# Patient Record
Sex: Female | Born: 1945
Health system: Southern US, Community
[De-identification: ages and names within clinical notes are randomized; demographics above are authoritative.]

## PROBLEM LIST (undated history)

## (undated) DIAGNOSIS — I10 Essential (primary) hypertension: Secondary | ICD-10-CM

---

## 2004-07-01 ENCOUNTER — Ambulatory Visit: Payer: Self-pay | Admitting: Internal Medicine

## 2005-07-03 ENCOUNTER — Ambulatory Visit: Payer: Self-pay | Admitting: Internal Medicine

## 2005-09-05 ENCOUNTER — Ambulatory Visit: Payer: Self-pay | Admitting: Gastroenterology

## 2006-07-07 ENCOUNTER — Ambulatory Visit: Payer: Self-pay | Admitting: Internal Medicine

## 2007-07-08 ENCOUNTER — Ambulatory Visit: Payer: Self-pay | Admitting: Internal Medicine

## 2007-11-11 ENCOUNTER — Ambulatory Visit: Payer: Self-pay | Admitting: Internal Medicine

## 2008-07-10 ENCOUNTER — Ambulatory Visit: Payer: Self-pay | Admitting: Internal Medicine

## 2008-08-22 ENCOUNTER — Ambulatory Visit: Payer: Self-pay | Admitting: Internal Medicine

## 2008-09-14 ENCOUNTER — Ambulatory Visit: Payer: Self-pay | Admitting: Gastroenterology

## 2009-07-12 ENCOUNTER — Ambulatory Visit: Payer: Self-pay | Admitting: Internal Medicine

## 2009-07-24 ENCOUNTER — Ambulatory Visit: Payer: Self-pay | Admitting: Internal Medicine

## 2009-08-10 ENCOUNTER — Ambulatory Visit: Payer: Self-pay | Admitting: Internal Medicine

## 2009-08-20 ENCOUNTER — Ambulatory Visit: Payer: Self-pay | Admitting: Internal Medicine

## 2009-09-05 ENCOUNTER — Ambulatory Visit: Payer: Self-pay | Admitting: Gastroenterology

## 2009-09-20 ENCOUNTER — Ambulatory Visit: Payer: Self-pay | Admitting: Internal Medicine

## 2010-07-06 ENCOUNTER — Emergency Department: Payer: Self-pay | Admitting: Emergency Medicine

## 2010-09-03 ENCOUNTER — Ambulatory Visit: Payer: Self-pay | Admitting: Internal Medicine

## 2010-09-09 ENCOUNTER — Ambulatory Visit: Payer: Self-pay | Admitting: Internal Medicine

## 2010-09-14 ENCOUNTER — Ambulatory Visit: Payer: Self-pay | Admitting: Ophthalmology

## 2011-09-04 ENCOUNTER — Ambulatory Visit: Payer: Self-pay | Admitting: Internal Medicine

## 2011-11-10 ENCOUNTER — Ambulatory Visit: Payer: Self-pay

## 2011-12-04 ENCOUNTER — Ambulatory Visit: Payer: Self-pay

## 2011-12-21 ENCOUNTER — Ambulatory Visit: Payer: Self-pay

## 2012-01-21 ENCOUNTER — Ambulatory Visit: Payer: Self-pay

## 2012-09-06 ENCOUNTER — Ambulatory Visit: Payer: Self-pay

## 2013-09-19 ENCOUNTER — Emergency Department: Payer: Self-pay | Admitting: Emergency Medicine

## 2013-09-20 LAB — CBC
HCT: 37.1 % (ref 35.0–47.0)
HGB: 12.4 g/dL (ref 12.0–16.0)
MCH: 28 pg (ref 26.0–34.0)
MCHC: 33.3 g/dL (ref 32.0–36.0)
MCV: 84 fL (ref 80–100)
PLATELETS: 199 10*3/uL (ref 150–440)
RBC: 4.41 10*6/uL (ref 3.80–5.20)
RDW: 15.6 % — AB (ref 11.5–14.5)
WBC: 10.6 10*3/uL (ref 3.6–11.0)

## 2013-09-20 LAB — URINALYSIS, COMPLETE
Bilirubin,UR: NEGATIVE
Blood: NEGATIVE
Glucose,UR: NEGATIVE mg/dL (ref 0–75)
KETONE: NEGATIVE
Nitrite: NEGATIVE
PH: 5 (ref 4.5–8.0)
Protein: NEGATIVE
RBC,UR: 2 /HPF (ref 0–5)
Specific Gravity: 1.019 (ref 1.003–1.030)
Squamous Epithelial: NONE SEEN

## 2013-09-20 LAB — COMPREHENSIVE METABOLIC PANEL
ALK PHOS: 73 U/L
ALT: 26 U/L
Albumin: 3.3 g/dL — ABNORMAL LOW (ref 3.4–5.0)
Anion Gap: 6 — ABNORMAL LOW (ref 7–16)
BUN: 16 mg/dL (ref 7–18)
Bilirubin,Total: 0.4 mg/dL (ref 0.2–1.0)
CALCIUM: 8.6 mg/dL (ref 8.5–10.1)
Chloride: 108 mmol/L — ABNORMAL HIGH (ref 98–107)
Co2: 28 mmol/L (ref 21–32)
Creatinine: 0.99 mg/dL (ref 0.60–1.30)
GFR CALC NON AF AMER: 58 — AB
GLUCOSE: 119 mg/dL — AB (ref 65–99)
OSMOLALITY: 285 (ref 275–301)
Potassium: 3.4 mmol/L — ABNORMAL LOW (ref 3.5–5.1)
SGOT(AST): 20 U/L (ref 15–37)
Sodium: 142 mmol/L (ref 136–145)
Total Protein: 6.9 g/dL (ref 6.4–8.2)

## 2013-09-21 LAB — URINE CULTURE

## 2013-11-07 ENCOUNTER — Ambulatory Visit: Payer: Self-pay | Admitting: Gastroenterology

## 2014-05-08 DIAGNOSIS — I10 Essential (primary) hypertension: Secondary | ICD-10-CM | POA: Diagnosis not present

## 2014-05-08 DIAGNOSIS — E780 Pure hypercholesterolemia: Secondary | ICD-10-CM | POA: Diagnosis not present

## 2014-05-08 DIAGNOSIS — E119 Type 2 diabetes mellitus without complications: Secondary | ICD-10-CM | POA: Diagnosis not present

## 2014-05-08 DIAGNOSIS — E538 Deficiency of other specified B group vitamins: Secondary | ICD-10-CM | POA: Diagnosis not present

## 2014-05-16 DIAGNOSIS — I10 Essential (primary) hypertension: Secondary | ICD-10-CM | POA: Diagnosis not present

## 2014-05-16 DIAGNOSIS — E119 Type 2 diabetes mellitus without complications: Secondary | ICD-10-CM | POA: Diagnosis not present

## 2014-05-16 DIAGNOSIS — M810 Age-related osteoporosis without current pathological fracture: Secondary | ICD-10-CM | POA: Diagnosis not present

## 2014-07-19 ENCOUNTER — Other Ambulatory Visit: Payer: Self-pay | Admitting: Internal Medicine

## 2014-07-19 DIAGNOSIS — Z1231 Encounter for screening mammogram for malignant neoplasm of breast: Secondary | ICD-10-CM

## 2014-07-25 ENCOUNTER — Ambulatory Visit: Payer: Self-pay

## 2014-07-26 ENCOUNTER — Ambulatory Visit
Admission: RE | Admit: 2014-07-26 | Discharge: 2014-07-26 | Disposition: A | Discharge status: Home / Self Care | Payer: Commercial Managed Care - HMO | Source: Ambulatory Visit | Attending: Internal Medicine | Admitting: Internal Medicine

## 2014-07-26 ENCOUNTER — Other Ambulatory Visit: Payer: Self-pay | Admitting: Internal Medicine

## 2014-07-26 DIAGNOSIS — Z1231 Encounter for screening mammogram for malignant neoplasm of breast: Secondary | ICD-10-CM

## 2014-07-31 ENCOUNTER — Other Ambulatory Visit: Payer: Self-pay | Admitting: Internal Medicine

## 2014-07-31 DIAGNOSIS — N6489 Other specified disorders of breast: Secondary | ICD-10-CM

## 2014-07-31 DIAGNOSIS — R928 Other abnormal and inconclusive findings on diagnostic imaging of breast: Secondary | ICD-10-CM

## 2014-08-02 ENCOUNTER — Ambulatory Visit
Admission: RE | Admit: 2014-08-02 | Discharge: 2014-08-02 | Disposition: A | Discharge status: Home / Self Care | Payer: Commercial Managed Care - HMO | Source: Ambulatory Visit | Attending: Internal Medicine | Admitting: Internal Medicine

## 2014-08-02 DIAGNOSIS — N6489 Other specified disorders of breast: Secondary | ICD-10-CM

## 2014-08-02 DIAGNOSIS — R928 Other abnormal and inconclusive findings on diagnostic imaging of breast: Secondary | ICD-10-CM

## 2014-08-25 DIAGNOSIS — J069 Acute upper respiratory infection, unspecified: Secondary | ICD-10-CM | POA: Diagnosis not present

## 2014-09-08 DIAGNOSIS — I10 Essential (primary) hypertension: Secondary | ICD-10-CM | POA: Diagnosis not present

## 2014-09-08 DIAGNOSIS — E119 Type 2 diabetes mellitus without complications: Secondary | ICD-10-CM | POA: Diagnosis not present

## 2014-09-08 DIAGNOSIS — M810 Age-related osteoporosis without current pathological fracture: Secondary | ICD-10-CM | POA: Diagnosis not present

## 2014-09-15 DIAGNOSIS — E780 Pure hypercholesterolemia: Secondary | ICD-10-CM | POA: Diagnosis not present

## 2014-09-15 DIAGNOSIS — R0989 Other specified symptoms and signs involving the circulatory and respiratory systems: Secondary | ICD-10-CM | POA: Diagnosis not present

## 2014-09-15 DIAGNOSIS — I10 Essential (primary) hypertension: Secondary | ICD-10-CM | POA: Diagnosis not present

## 2014-09-15 DIAGNOSIS — E119 Type 2 diabetes mellitus without complications: Secondary | ICD-10-CM | POA: Diagnosis not present

## 2014-09-22 DIAGNOSIS — I10 Essential (primary) hypertension: Secondary | ICD-10-CM | POA: Diagnosis not present

## 2014-09-22 DIAGNOSIS — E780 Pure hypercholesterolemia: Secondary | ICD-10-CM | POA: Diagnosis not present

## 2014-09-22 DIAGNOSIS — I6523 Occlusion and stenosis of bilateral carotid arteries: Secondary | ICD-10-CM | POA: Diagnosis not present

## 2014-09-22 DIAGNOSIS — R0989 Other specified symptoms and signs involving the circulatory and respiratory systems: Secondary | ICD-10-CM | POA: Diagnosis not present

## 2014-10-27 DIAGNOSIS — Z23 Encounter for immunization: Secondary | ICD-10-CM | POA: Diagnosis not present

## 2015-03-13 DIAGNOSIS — I10 Essential (primary) hypertension: Secondary | ICD-10-CM | POA: Diagnosis not present

## 2015-03-13 DIAGNOSIS — E119 Type 2 diabetes mellitus without complications: Secondary | ICD-10-CM | POA: Diagnosis not present

## 2015-03-13 DIAGNOSIS — R0989 Other specified symptoms and signs involving the circulatory and respiratory systems: Secondary | ICD-10-CM | POA: Diagnosis not present

## 2015-03-13 DIAGNOSIS — E7800 Pure hypercholesterolemia, unspecified: Secondary | ICD-10-CM | POA: Diagnosis not present

## 2015-03-19 DIAGNOSIS — E119 Type 2 diabetes mellitus without complications: Secondary | ICD-10-CM | POA: Diagnosis not present

## 2015-03-19 DIAGNOSIS — G2581 Restless legs syndrome: Secondary | ICD-10-CM | POA: Diagnosis not present

## 2015-03-19 DIAGNOSIS — E7800 Pure hypercholesterolemia, unspecified: Secondary | ICD-10-CM | POA: Diagnosis not present

## 2015-03-19 DIAGNOSIS — I10 Essential (primary) hypertension: Secondary | ICD-10-CM | POA: Diagnosis not present

## 2015-04-27 DIAGNOSIS — J069 Acute upper respiratory infection, unspecified: Secondary | ICD-10-CM | POA: Diagnosis not present

## 2015-05-24 DIAGNOSIS — E119 Type 2 diabetes mellitus without complications: Secondary | ICD-10-CM | POA: Diagnosis not present

## 2015-08-20 ENCOUNTER — Other Ambulatory Visit: Payer: Self-pay | Admitting: Internal Medicine

## 2015-08-20 DIAGNOSIS — Z1231 Encounter for screening mammogram for malignant neoplasm of breast: Secondary | ICD-10-CM

## 2015-09-05 ENCOUNTER — Ambulatory Visit: Payer: Medicare HMO

## 2015-09-12 DIAGNOSIS — E7800 Pure hypercholesterolemia, unspecified: Secondary | ICD-10-CM | POA: Diagnosis not present

## 2015-09-12 DIAGNOSIS — G2581 Restless legs syndrome: Secondary | ICD-10-CM | POA: Diagnosis not present

## 2015-09-12 DIAGNOSIS — I10 Essential (primary) hypertension: Secondary | ICD-10-CM | POA: Diagnosis not present

## 2015-09-12 DIAGNOSIS — E119 Type 2 diabetes mellitus without complications: Secondary | ICD-10-CM | POA: Diagnosis not present

## 2015-09-19 DIAGNOSIS — Z0000 Encounter for general adult medical examination without abnormal findings: Secondary | ICD-10-CM | POA: Diagnosis not present

## 2015-09-19 DIAGNOSIS — Z780 Asymptomatic menopausal state: Secondary | ICD-10-CM | POA: Diagnosis not present

## 2015-09-19 DIAGNOSIS — E119 Type 2 diabetes mellitus without complications: Secondary | ICD-10-CM | POA: Diagnosis not present

## 2015-09-19 DIAGNOSIS — I10 Essential (primary) hypertension: Secondary | ICD-10-CM | POA: Diagnosis not present

## 2015-09-19 DIAGNOSIS — E7800 Pure hypercholesterolemia, unspecified: Secondary | ICD-10-CM | POA: Diagnosis not present

## 2015-09-27 DIAGNOSIS — Z780 Asymptomatic menopausal state: Secondary | ICD-10-CM | POA: Diagnosis not present

## 2015-10-12 DIAGNOSIS — Z23 Encounter for immunization: Secondary | ICD-10-CM | POA: Diagnosis not present

## 2015-10-15 ENCOUNTER — Ambulatory Visit
Admission: RE | Admit: 2015-10-15 | Discharge: 2015-10-15 | Disposition: A | Discharge status: Home / Self Care | Payer: Commercial Managed Care - HMO | Source: Ambulatory Visit | Attending: Internal Medicine | Admitting: Internal Medicine

## 2015-10-15 ENCOUNTER — Other Ambulatory Visit: Payer: Self-pay | Admitting: Internal Medicine

## 2015-10-15 DIAGNOSIS — Z1231 Encounter for screening mammogram for malignant neoplasm of breast: Secondary | ICD-10-CM | POA: Diagnosis not present

## 2015-12-27 DIAGNOSIS — J069 Acute upper respiratory infection, unspecified: Secondary | ICD-10-CM | POA: Diagnosis not present

## 2016-01-01 DIAGNOSIS — J0100 Acute maxillary sinusitis, unspecified: Secondary | ICD-10-CM | POA: Diagnosis not present

## 2016-03-12 DIAGNOSIS — E119 Type 2 diabetes mellitus without complications: Secondary | ICD-10-CM | POA: Diagnosis not present

## 2016-03-12 DIAGNOSIS — E7800 Pure hypercholesterolemia, unspecified: Secondary | ICD-10-CM | POA: Diagnosis not present

## 2016-03-12 DIAGNOSIS — I10 Essential (primary) hypertension: Secondary | ICD-10-CM | POA: Diagnosis not present

## 2016-03-19 DIAGNOSIS — K219 Gastro-esophageal reflux disease without esophagitis: Secondary | ICD-10-CM | POA: Diagnosis not present

## 2016-03-19 DIAGNOSIS — E7800 Pure hypercholesterolemia, unspecified: Secondary | ICD-10-CM | POA: Diagnosis not present

## 2016-03-19 DIAGNOSIS — E119 Type 2 diabetes mellitus without complications: Secondary | ICD-10-CM | POA: Diagnosis not present

## 2016-03-19 DIAGNOSIS — I10 Essential (primary) hypertension: Secondary | ICD-10-CM | POA: Diagnosis not present

## 2016-03-19 DIAGNOSIS — Z0000 Encounter for general adult medical examination without abnormal findings: Secondary | ICD-10-CM | POA: Diagnosis not present

## 2016-08-14 ENCOUNTER — Emergency Department: Payer: Medicare HMO

## 2016-08-14 ENCOUNTER — Emergency Department
Admission: EM | Admit: 2016-08-14 | Discharge: 2016-08-14 | Disposition: A | Discharge status: Home / Self Care | Payer: Medicare HMO | Attending: Student in an Organized Health Care Education/Training Program | Admitting: Student in an Organized Health Care Education/Training Program

## 2016-08-14 ENCOUNTER — Encounter: Payer: Self-pay | Admitting: Emergency Medicine

## 2016-08-14 DIAGNOSIS — W01198A Fall on same level from slipping, tripping and stumbling with subsequent striking against other object, initial encounter: Secondary | ICD-10-CM | POA: Insufficient documentation

## 2016-08-14 DIAGNOSIS — S0990XA Unspecified injury of head, initial encounter: Secondary | ICD-10-CM | POA: Diagnosis not present

## 2016-08-14 DIAGNOSIS — S4991XA Unspecified injury of right shoulder and upper arm, initial encounter: Secondary | ICD-10-CM | POA: Diagnosis not present

## 2016-08-14 DIAGNOSIS — I10 Essential (primary) hypertension: Secondary | ICD-10-CM | POA: Diagnosis not present

## 2016-08-14 DIAGNOSIS — Y998 Other external cause status: Secondary | ICD-10-CM | POA: Diagnosis not present

## 2016-08-14 DIAGNOSIS — M25511 Pain in right shoulder: Secondary | ICD-10-CM | POA: Insufficient documentation

## 2016-08-14 DIAGNOSIS — Y9389 Activity, other specified: Secondary | ICD-10-CM | POA: Insufficient documentation

## 2016-08-14 DIAGNOSIS — Y92513 Shop (commercial) as the place of occurrence of the external cause: Secondary | ICD-10-CM | POA: Insufficient documentation

## 2016-08-14 HISTORY — DX: Essential (primary) hypertension: I10

## 2016-08-14 MED ORDER — TETANUS-DIPHTH-ACELL PERTUSSIS 5-2.5-18.5 LF-MCG/0.5 IM SUSP
0.5000 mL | Freq: Once | INTRAMUSCULAR | Status: AC
  Administered 2016-08-14: 0.5 mL via INTRAMUSCULAR
  Filled 2016-08-14 (×2): qty 0.5

## 2016-08-14 NOTE — ED Provider Notes (Signed)
[facility] Emergency Department Provider Note    First MD Initiated Contact with Patient 08/14/16 1648     (approximate)  I have reviewed the triage vital signs and the nursing notes.   HISTORY  Chief Complaint Fall    HPI Kim Hayes is a 71 y.o. female presents with mechanical fall with head injury and right shoulder pain. Patient states that she was in shop and tripped over a step and fell and hit her face against the commode and her right shoulder. Denies any LOC. Denies any blurry vision. States that after the fall she did blow her nose and had blood come out of the left naris. Does not have any numbness or tingling. No chest pain. She is able to and related after the accident but does have a small laceration to her left great toe. No other associated pain.   Past Medical History:  Diagnosis Date  . Hypertension    Family History  Problem Relation Age of Onset  . Lung cancer Father    History reviewed. No pertinent surgical history. There are no active problems to display for this patient.     Prior to Admission medications   Not on File    Allergies Patient has no known allergies.    Social History Social History  Substance Use Topics  . Smoking status: Never Smoker  . Smokeless tobacco: Never Used  . Alcohol use No    Review of Systems Patient denies headaches, rhinorrhea, blurry vision, numbness, shortness of breath, chest pain, edema, cough, abdominal pain, nausea, vomiting, diarrhea, dysuria, fevers, rashes or hallucinations unless otherwise stated above in HPI. ____________________________________________   PHYSICAL EXAM:  VITAL SIGNS: Vitals:   08/14/16 1559  BP: (!) 165/82  Pulse: 71  Resp: 16  Temp: 98.2 F (36.8 C)    Constitutional: Alert and oriented. Well appearing and in no acute distress. Eyes: Conjunctivae are normal.  Head: ttp of right superior jaw without point tenderness or swelling. Nose:  No congestion/rhinnorhea. Mouth/Throat: Mucous membranes are moist. No trismus, dentures atraumatic.    Neck: No stridor. Painless ROM. No c-spine ttp Cardiovascular: Normal rate, regular rhythm. Grossly normal heart sounds.  Good peripheral circulation. Respiratory: Normal respiratory effort.  No retractions. Lungs CTAB. Gastrointestinal: Soft and nontender. No distention. No abdominal bruits. No CVA tenderness. Musculoskeletal:  ttp along right posterior shoulder muscles, no bony ttp, some discomfort with passive raise above 90 degrees, SILT distally.  2+ pulses.   No lower extremity tenderness nor edema.  No joint effusions. Neurologic:  Normal speech and language. No gross focal neurologic deficits are appreciated. No facial droop Skin:  Skin is warm, dry and intact. No rash noted.  1cm superficial laceration to left great toe, no subungal hematoma Psychiatric: Mood and affect are normal. Speech and behavior are normal.  ____________________________________________   LABS (all labs ordered are listed, but only abnormal results are displayed)  No results found for this or any previous visit (from the past 24 hour(s)). ____________________________________________ ______________________________  CVELFYBOF  I personally reviewed all radiographic images ordered to evaluate for the above acute complaints and reviewed radiology reports and findings.  These findings were personally discussed with the patient.  Please see medical record for radiology report.  ____________________________________________   PROCEDURES  Procedure(s) performed:  Procedures    Critical Care performed: no ____________________________________________   INITIAL IMPRESSION / ASSESSMENT AND PLAN / ED COURSE  Pertinent labs & imaging results that were available during my care  of the patient were reviewed by me and considered in my medical decision making (see chart for details).  DDX: sah, sdh, edh,  fracture, contusion, soft tissue injury, viscous injury, concussion, hemorrhage   Kim Hayes is a 71 y.o. who presents to the ED with head injury and right shoulder pain after mechanical fall as described above. She is afebrile and otherwise hemodynamically stable. She is no evidence of extraocular muscle entrapment. No evidence of periorbital swelling. No evidence of trismus or jaw fracture no evidence of a denture trauma. No active bleeding from her nares. Does have some mild tenderness but no significant pain or swelling to suggest clinically significant fracture. CT head without any evidence of acute intracranial abnormality. Patient without any neck pain. Shoulder without any evidence of fracture or dislocation. Possible rotator cuff injury. Tetanus is updated. Patient will be provided sling and referral to orthopedics. Discussed signs and symptoms for which patient should return immediately to the hospital.  Have discussed with the patient and available family all diagnostics and treatments performed thus far and all questions were answered to the best of my ability. The patient demonstrates understanding and agreement with plan.       ____________________________________________   FINAL CLINICAL IMPRESSION(S) / ED DIAGNOSES  Final diagnoses:  Injury of head, initial encounter  Acute pain of right shoulder      NEW MEDICATIONS STARTED DURING THIS VISIT:  New Prescriptions   No medications on file     Note:  This document was prepared using Dragon voice recognition software and may include unintentional dictation errors.    Merlyn Lot, MD 08/14/16 1719

## 2016-08-14 NOTE — ED Triage Notes (Addendum)
Pt to ED via POV with c/o mechanical fall c/o facial pain and RT shoulder pain. Pt fell in bathroom. Pt denies any LOC, A&Ox4, VS stable. Pt takes daily Asprin. Pt ambulatory, NAD noted at this time

## 2016-08-22 DIAGNOSIS — S46011A Strain of muscle(s) and tendon(s) of the rotator cuff of right shoulder, initial encounter: Secondary | ICD-10-CM | POA: Diagnosis not present

## 2016-08-22 DIAGNOSIS — M19011 Primary osteoarthritis, right shoulder: Secondary | ICD-10-CM | POA: Diagnosis not present

## 2016-09-12 DIAGNOSIS — I10 Essential (primary) hypertension: Secondary | ICD-10-CM | POA: Diagnosis not present

## 2016-09-12 DIAGNOSIS — E119 Type 2 diabetes mellitus without complications: Secondary | ICD-10-CM | POA: Diagnosis not present

## 2016-09-12 DIAGNOSIS — E7800 Pure hypercholesterolemia, unspecified: Secondary | ICD-10-CM | POA: Diagnosis not present

## 2016-09-12 DIAGNOSIS — Z0000 Encounter for general adult medical examination without abnormal findings: Secondary | ICD-10-CM | POA: Diagnosis not present

## 2016-09-19 DIAGNOSIS — S46011S Strain of muscle(s) and tendon(s) of the rotator cuff of right shoulder, sequela: Secondary | ICD-10-CM | POA: Diagnosis not present

## 2016-09-19 DIAGNOSIS — E7800 Pure hypercholesterolemia, unspecified: Secondary | ICD-10-CM | POA: Diagnosis not present

## 2016-09-19 DIAGNOSIS — Z23 Encounter for immunization: Secondary | ICD-10-CM | POA: Diagnosis not present

## 2016-09-19 DIAGNOSIS — R51 Headache: Secondary | ICD-10-CM | POA: Diagnosis not present

## 2016-09-19 DIAGNOSIS — Z0000 Encounter for general adult medical examination without abnormal findings: Secondary | ICD-10-CM | POA: Diagnosis not present

## 2016-09-19 DIAGNOSIS — I10 Essential (primary) hypertension: Secondary | ICD-10-CM | POA: Diagnosis not present

## 2016-09-19 DIAGNOSIS — E119 Type 2 diabetes mellitus without complications: Secondary | ICD-10-CM | POA: Diagnosis not present

## 2016-09-19 DIAGNOSIS — M25611 Stiffness of right shoulder, not elsewhere classified: Secondary | ICD-10-CM | POA: Diagnosis not present

## 2016-09-19 DIAGNOSIS — M25511 Pain in right shoulder: Secondary | ICD-10-CM | POA: Diagnosis not present

## 2016-09-19 DIAGNOSIS — Z1231 Encounter for screening mammogram for malignant neoplasm of breast: Secondary | ICD-10-CM | POA: Diagnosis not present

## 2016-09-24 DIAGNOSIS — M25511 Pain in right shoulder: Secondary | ICD-10-CM | POA: Diagnosis not present

## 2016-09-24 DIAGNOSIS — M25611 Stiffness of right shoulder, not elsewhere classified: Secondary | ICD-10-CM | POA: Diagnosis not present

## 2016-09-29 DIAGNOSIS — I10 Essential (primary) hypertension: Secondary | ICD-10-CM | POA: Diagnosis not present

## 2016-09-29 DIAGNOSIS — L509 Urticaria, unspecified: Secondary | ICD-10-CM | POA: Diagnosis not present

## 2016-09-29 DIAGNOSIS — E119 Type 2 diabetes mellitus without complications: Secondary | ICD-10-CM | POA: Diagnosis not present

## 2016-10-01 DIAGNOSIS — M19011 Primary osteoarthritis, right shoulder: Secondary | ICD-10-CM | POA: Diagnosis not present

## 2016-10-01 DIAGNOSIS — M25611 Stiffness of right shoulder, not elsewhere classified: Secondary | ICD-10-CM | POA: Diagnosis not present

## 2016-10-01 DIAGNOSIS — M25511 Pain in right shoulder: Secondary | ICD-10-CM | POA: Diagnosis not present

## 2016-10-08 ENCOUNTER — Other Ambulatory Visit: Payer: Self-pay | Admitting: Internal Medicine

## 2016-10-08 DIAGNOSIS — Z1231 Encounter for screening mammogram for malignant neoplasm of breast: Secondary | ICD-10-CM

## 2016-10-10 DIAGNOSIS — Z23 Encounter for immunization: Secondary | ICD-10-CM | POA: Diagnosis not present

## 2016-10-21 ENCOUNTER — Ambulatory Visit
Admission: RE | Admit: 2016-10-21 | Discharge: 2016-10-21 | Disposition: A | Discharge status: Home / Self Care | Payer: Medicare HMO | Source: Ambulatory Visit | Attending: Internal Medicine | Admitting: Internal Medicine

## 2016-10-21 DIAGNOSIS — Z1231 Encounter for screening mammogram for malignant neoplasm of breast: Secondary | ICD-10-CM | POA: Insufficient documentation

## 2017-03-12 DIAGNOSIS — I10 Essential (primary) hypertension: Secondary | ICD-10-CM | POA: Diagnosis not present

## 2017-03-12 DIAGNOSIS — Z1231 Encounter for screening mammogram for malignant neoplasm of breast: Secondary | ICD-10-CM | POA: Diagnosis not present

## 2017-03-12 DIAGNOSIS — E119 Type 2 diabetes mellitus without complications: Secondary | ICD-10-CM | POA: Diagnosis not present

## 2017-03-12 DIAGNOSIS — Z23 Encounter for immunization: Secondary | ICD-10-CM | POA: Diagnosis not present

## 2017-03-12 DIAGNOSIS — E7800 Pure hypercholesterolemia, unspecified: Secondary | ICD-10-CM | POA: Diagnosis not present

## 2017-03-12 DIAGNOSIS — Z0000 Encounter for general adult medical examination without abnormal findings: Secondary | ICD-10-CM | POA: Diagnosis not present

## 2017-03-19 DIAGNOSIS — E7800 Pure hypercholesterolemia, unspecified: Secondary | ICD-10-CM | POA: Diagnosis not present

## 2017-03-19 DIAGNOSIS — E119 Type 2 diabetes mellitus without complications: Secondary | ICD-10-CM | POA: Diagnosis not present

## 2017-03-19 DIAGNOSIS — I10 Essential (primary) hypertension: Secondary | ICD-10-CM | POA: Diagnosis not present

## 2017-03-19 DIAGNOSIS — Z0000 Encounter for general adult medical examination without abnormal findings: Secondary | ICD-10-CM | POA: Diagnosis not present

## 2017-03-19 DIAGNOSIS — R0989 Other specified symptoms and signs involving the circulatory and respiratory systems: Secondary | ICD-10-CM | POA: Diagnosis not present

## 2017-05-27 DIAGNOSIS — J019 Acute sinusitis, unspecified: Secondary | ICD-10-CM | POA: Diagnosis not present

## 2017-05-27 DIAGNOSIS — I10 Essential (primary) hypertension: Secondary | ICD-10-CM | POA: Diagnosis not present

## 2017-05-27 DIAGNOSIS — E119 Type 2 diabetes mellitus without complications: Secondary | ICD-10-CM | POA: Diagnosis not present

## 2017-09-04 DIAGNOSIS — B9689 Other specified bacterial agents as the cause of diseases classified elsewhere: Secondary | ICD-10-CM | POA: Diagnosis not present

## 2017-09-04 DIAGNOSIS — J019 Acute sinusitis, unspecified: Secondary | ICD-10-CM | POA: Diagnosis not present

## 2017-09-04 DIAGNOSIS — R51 Headache: Secondary | ICD-10-CM | POA: Diagnosis not present

## 2017-09-04 DIAGNOSIS — R509 Fever, unspecified: Secondary | ICD-10-CM | POA: Diagnosis not present

## 2017-09-17 DIAGNOSIS — I10 Essential (primary) hypertension: Secondary | ICD-10-CM | POA: Diagnosis not present

## 2017-09-17 DIAGNOSIS — E119 Type 2 diabetes mellitus without complications: Secondary | ICD-10-CM | POA: Diagnosis not present

## 2017-09-17 DIAGNOSIS — R0989 Other specified symptoms and signs involving the circulatory and respiratory systems: Secondary | ICD-10-CM | POA: Diagnosis not present

## 2017-09-17 DIAGNOSIS — E7800 Pure hypercholesterolemia, unspecified: Secondary | ICD-10-CM | POA: Diagnosis not present

## 2017-09-24 ENCOUNTER — Other Ambulatory Visit: Payer: Self-pay | Admitting: Internal Medicine

## 2017-09-24 DIAGNOSIS — D649 Anemia, unspecified: Secondary | ICD-10-CM | POA: Diagnosis not present

## 2017-09-24 DIAGNOSIS — E7800 Pure hypercholesterolemia, unspecified: Secondary | ICD-10-CM | POA: Diagnosis not present

## 2017-09-24 DIAGNOSIS — I10 Essential (primary) hypertension: Secondary | ICD-10-CM | POA: Diagnosis not present

## 2017-09-24 DIAGNOSIS — E119 Type 2 diabetes mellitus without complications: Secondary | ICD-10-CM | POA: Diagnosis not present

## 2017-09-24 DIAGNOSIS — Z1239 Encounter for other screening for malignant neoplasm of breast: Secondary | ICD-10-CM | POA: Diagnosis not present

## 2017-09-24 DIAGNOSIS — Z0000 Encounter for general adult medical examination without abnormal findings: Secondary | ICD-10-CM | POA: Diagnosis not present

## 2017-09-24 DIAGNOSIS — R221 Localized swelling, mass and lump, neck: Secondary | ICD-10-CM | POA: Diagnosis not present

## 2017-09-24 DIAGNOSIS — Z1231 Encounter for screening mammogram for malignant neoplasm of breast: Secondary | ICD-10-CM

## 2017-09-24 DIAGNOSIS — M810 Age-related osteoporosis without current pathological fracture: Secondary | ICD-10-CM | POA: Diagnosis not present

## 2017-10-02 ENCOUNTER — Other Ambulatory Visit: Payer: Self-pay | Admitting: Internal Medicine

## 2017-10-02 DIAGNOSIS — R221 Localized swelling, mass and lump, neck: Secondary | ICD-10-CM

## 2017-10-12 ENCOUNTER — Ambulatory Visit
Admission: RE | Admit: 2017-10-12 | Discharge: 2017-10-12 | Disposition: A | Discharge status: Home / Self Care | Payer: Medicare HMO | Source: Ambulatory Visit | Attending: Internal Medicine | Admitting: Internal Medicine

## 2017-10-12 DIAGNOSIS — R221 Localized swelling, mass and lump, neck: Secondary | ICD-10-CM | POA: Diagnosis not present

## 2017-10-12 MED ORDER — IOPAMIDOL (ISOVUE-300) INJECTION 61%
75.0000 mL | Freq: Once | INTRAVENOUS | Status: AC | PRN
  Administered 2017-10-12: 75 mL via INTRAVENOUS

## 2017-10-22 ENCOUNTER — Ambulatory Visit
Admission: RE | Admit: 2017-10-22 | Discharge: 2017-10-22 | Disposition: A | Discharge status: Home / Self Care | Payer: Medicare HMO | Source: Ambulatory Visit | Attending: Internal Medicine | Admitting: Internal Medicine

## 2017-10-22 DIAGNOSIS — Z1231 Encounter for screening mammogram for malignant neoplasm of breast: Secondary | ICD-10-CM | POA: Diagnosis not present

## 2017-10-23 DIAGNOSIS — Z23 Encounter for immunization: Secondary | ICD-10-CM | POA: Diagnosis not present

## 2017-10-30 DIAGNOSIS — L304 Erythema intertrigo: Secondary | ICD-10-CM | POA: Diagnosis not present

## 2017-10-30 DIAGNOSIS — L820 Inflamed seborrheic keratosis: Secondary | ICD-10-CM | POA: Diagnosis not present

## 2017-10-30 DIAGNOSIS — L821 Other seborrheic keratosis: Secondary | ICD-10-CM | POA: Diagnosis not present

## 2017-10-30 DIAGNOSIS — R58 Hemorrhage, not elsewhere classified: Secondary | ICD-10-CM | POA: Diagnosis not present

## 2017-12-21 DIAGNOSIS — E119 Type 2 diabetes mellitus without complications: Secondary | ICD-10-CM | POA: Diagnosis not present

## 2018-01-02 DIAGNOSIS — J00 Acute nasopharyngitis [common cold]: Secondary | ICD-10-CM | POA: Diagnosis not present

## 2018-03-18 DIAGNOSIS — Z0000 Encounter for general adult medical examination without abnormal findings: Secondary | ICD-10-CM | POA: Diagnosis not present

## 2018-03-18 DIAGNOSIS — Z79899 Other long term (current) drug therapy: Secondary | ICD-10-CM | POA: Diagnosis not present

## 2018-03-18 DIAGNOSIS — D649 Anemia, unspecified: Secondary | ICD-10-CM | POA: Diagnosis not present

## 2018-03-18 DIAGNOSIS — E119 Type 2 diabetes mellitus without complications: Secondary | ICD-10-CM | POA: Diagnosis not present

## 2018-03-18 DIAGNOSIS — M810 Age-related osteoporosis without current pathological fracture: Secondary | ICD-10-CM | POA: Diagnosis not present

## 2018-03-18 DIAGNOSIS — I10 Essential (primary) hypertension: Secondary | ICD-10-CM | POA: Diagnosis not present

## 2018-03-18 DIAGNOSIS — E7800 Pure hypercholesterolemia, unspecified: Secondary | ICD-10-CM | POA: Diagnosis not present

## 2018-03-25 DIAGNOSIS — R29898 Other symptoms and signs involving the musculoskeletal system: Secondary | ICD-10-CM | POA: Diagnosis not present

## 2018-03-25 DIAGNOSIS — E1165 Type 2 diabetes mellitus with hyperglycemia: Secondary | ICD-10-CM | POA: Diagnosis not present

## 2018-03-25 DIAGNOSIS — I10 Essential (primary) hypertension: Secondary | ICD-10-CM | POA: Diagnosis not present

## 2018-03-25 DIAGNOSIS — Z0000 Encounter for general adult medical examination without abnormal findings: Secondary | ICD-10-CM | POA: Diagnosis not present

## 2018-03-25 DIAGNOSIS — M810 Age-related osteoporosis without current pathological fracture: Secondary | ICD-10-CM | POA: Diagnosis not present

## 2018-08-02 ENCOUNTER — Other Ambulatory Visit: Payer: Self-pay | Admitting: Internal Medicine

## 2018-09-23 DIAGNOSIS — M810 Age-related osteoporosis without current pathological fracture: Secondary | ICD-10-CM | POA: Diagnosis not present

## 2018-09-23 DIAGNOSIS — Z0000 Encounter for general adult medical examination without abnormal findings: Secondary | ICD-10-CM | POA: Diagnosis not present

## 2018-09-23 DIAGNOSIS — E1165 Type 2 diabetes mellitus with hyperglycemia: Secondary | ICD-10-CM | POA: Diagnosis not present

## 2018-09-23 DIAGNOSIS — R29898 Other symptoms and signs involving the musculoskeletal system: Secondary | ICD-10-CM | POA: Diagnosis not present

## 2018-09-23 DIAGNOSIS — I10 Essential (primary) hypertension: Secondary | ICD-10-CM | POA: Diagnosis not present

## 2018-09-30 ENCOUNTER — Other Ambulatory Visit: Payer: Self-pay | Admitting: Internal Medicine

## 2018-09-30 DIAGNOSIS — Z1231 Encounter for screening mammogram for malignant neoplasm of breast: Secondary | ICD-10-CM

## 2018-09-30 DIAGNOSIS — R0989 Other specified symptoms and signs involving the circulatory and respiratory systems: Secondary | ICD-10-CM | POA: Diagnosis not present

## 2018-09-30 DIAGNOSIS — I10 Essential (primary) hypertension: Secondary | ICD-10-CM | POA: Diagnosis not present

## 2018-09-30 DIAGNOSIS — Z23 Encounter for immunization: Secondary | ICD-10-CM | POA: Diagnosis not present

## 2018-09-30 DIAGNOSIS — R221 Localized swelling, mass and lump, neck: Secondary | ICD-10-CM | POA: Diagnosis not present

## 2018-09-30 DIAGNOSIS — Z7984 Long term (current) use of oral hypoglycemic drugs: Secondary | ICD-10-CM | POA: Diagnosis not present

## 2018-09-30 DIAGNOSIS — E785 Hyperlipidemia, unspecified: Secondary | ICD-10-CM | POA: Diagnosis not present

## 2018-09-30 DIAGNOSIS — Z0000 Encounter for general adult medical examination without abnormal findings: Secondary | ICD-10-CM | POA: Diagnosis not present

## 2018-09-30 DIAGNOSIS — E119 Type 2 diabetes mellitus without complications: Secondary | ICD-10-CM | POA: Diagnosis not present

## 2018-09-30 DIAGNOSIS — G2581 Restless legs syndrome: Secondary | ICD-10-CM | POA: Diagnosis not present

## 2018-10-08 DIAGNOSIS — Z23 Encounter for immunization: Secondary | ICD-10-CM | POA: Diagnosis not present

## 2018-11-09 ENCOUNTER — Ambulatory Visit
Admission: RE | Admit: 2018-11-09 | Discharge: 2018-11-09 | Disposition: A | Discharge status: Home / Self Care | Payer: Medicare HMO | Source: Ambulatory Visit | Attending: Internal Medicine | Admitting: Internal Medicine

## 2018-11-09 DIAGNOSIS — Z1231 Encounter for screening mammogram for malignant neoplasm of breast: Secondary | ICD-10-CM | POA: Diagnosis not present

## 2018-12-23 DIAGNOSIS — E119 Type 2 diabetes mellitus without complications: Secondary | ICD-10-CM | POA: Diagnosis not present

## 2019-03-23 DIAGNOSIS — I10 Essential (primary) hypertension: Secondary | ICD-10-CM | POA: Diagnosis not present

## 2019-03-23 DIAGNOSIS — E1165 Type 2 diabetes mellitus with hyperglycemia: Secondary | ICD-10-CM | POA: Diagnosis not present

## 2019-03-23 DIAGNOSIS — E7800 Pure hypercholesterolemia, unspecified: Secondary | ICD-10-CM | POA: Diagnosis not present

## 2019-03-23 DIAGNOSIS — Z0000 Encounter for general adult medical examination without abnormal findings: Secondary | ICD-10-CM | POA: Diagnosis not present

## 2019-03-30 DIAGNOSIS — I10 Essential (primary) hypertension: Secondary | ICD-10-CM | POA: Diagnosis not present

## 2019-03-30 DIAGNOSIS — E785 Hyperlipidemia, unspecified: Secondary | ICD-10-CM | POA: Diagnosis not present

## 2019-03-30 DIAGNOSIS — Z87891 Personal history of nicotine dependence: Secondary | ICD-10-CM | POA: Diagnosis not present

## 2019-03-30 DIAGNOSIS — E119 Type 2 diabetes mellitus without complications: Secondary | ICD-10-CM | POA: Diagnosis not present

## 2019-03-30 DIAGNOSIS — R0989 Other specified symptoms and signs involving the circulatory and respiratory systems: Secondary | ICD-10-CM | POA: Diagnosis not present

## 2019-03-30 DIAGNOSIS — Z79899 Other long term (current) drug therapy: Secondary | ICD-10-CM | POA: Diagnosis not present

## 2019-03-30 DIAGNOSIS — G2581 Restless legs syndrome: Secondary | ICD-10-CM | POA: Diagnosis not present

## 2019-03-30 DIAGNOSIS — Z0000 Encounter for general adult medical examination without abnormal findings: Secondary | ICD-10-CM | POA: Diagnosis not present

## 2019-03-30 DIAGNOSIS — E559 Vitamin D deficiency, unspecified: Secondary | ICD-10-CM | POA: Diagnosis not present

## 2019-05-13 DIAGNOSIS — K219 Gastro-esophageal reflux disease without esophagitis: Secondary | ICD-10-CM | POA: Diagnosis not present

## 2019-05-13 DIAGNOSIS — Z86010 Personal history of colonic polyps: Secondary | ICD-10-CM | POA: Diagnosis not present

## 2019-05-13 DIAGNOSIS — Z01812 Encounter for preprocedural laboratory examination: Secondary | ICD-10-CM | POA: Diagnosis not present

## 2019-09-28 DIAGNOSIS — Z01812 Encounter for preprocedural laboratory examination: Secondary | ICD-10-CM | POA: Diagnosis not present

## 2019-09-29 DIAGNOSIS — E7800 Pure hypercholesterolemia, unspecified: Secondary | ICD-10-CM | POA: Diagnosis not present

## 2019-09-29 DIAGNOSIS — E1165 Type 2 diabetes mellitus with hyperglycemia: Secondary | ICD-10-CM | POA: Diagnosis not present

## 2019-09-29 DIAGNOSIS — R8290 Unspecified abnormal findings in urine: Secondary | ICD-10-CM | POA: Diagnosis not present

## 2019-09-29 DIAGNOSIS — I10 Essential (primary) hypertension: Secondary | ICD-10-CM | POA: Diagnosis not present

## 2019-09-30 DIAGNOSIS — K640 First degree hemorrhoids: Secondary | ICD-10-CM | POA: Diagnosis not present

## 2019-09-30 DIAGNOSIS — D125 Benign neoplasm of sigmoid colon: Secondary | ICD-10-CM | POA: Diagnosis not present

## 2019-09-30 DIAGNOSIS — Z86010 Personal history of colonic polyps: Secondary | ICD-10-CM | POA: Diagnosis not present

## 2019-09-30 DIAGNOSIS — Z1211 Encounter for screening for malignant neoplasm of colon: Secondary | ICD-10-CM | POA: Diagnosis not present

## 2019-09-30 DIAGNOSIS — K635 Polyp of colon: Secondary | ICD-10-CM | POA: Diagnosis not present

## 2019-10-04 ENCOUNTER — Other Ambulatory Visit: Payer: Self-pay | Admitting: Internal Medicine

## 2019-10-04 DIAGNOSIS — Z1231 Encounter for screening mammogram for malignant neoplasm of breast: Secondary | ICD-10-CM

## 2019-10-07 DIAGNOSIS — E785 Hyperlipidemia, unspecified: Secondary | ICD-10-CM | POA: Diagnosis not present

## 2019-10-07 DIAGNOSIS — Z23 Encounter for immunization: Secondary | ICD-10-CM | POA: Diagnosis not present

## 2019-10-07 DIAGNOSIS — I10 Essential (primary) hypertension: Secondary | ICD-10-CM | POA: Diagnosis not present

## 2019-10-07 DIAGNOSIS — E119 Type 2 diabetes mellitus without complications: Secondary | ICD-10-CM | POA: Diagnosis not present

## 2019-10-07 DIAGNOSIS — Z0000 Encounter for general adult medical examination without abnormal findings: Secondary | ICD-10-CM | POA: Diagnosis not present

## 2019-10-19 ENCOUNTER — Other Ambulatory Visit: Payer: Self-pay | Admitting: Internal Medicine

## 2019-10-19 DIAGNOSIS — R609 Edema, unspecified: Secondary | ICD-10-CM

## 2019-11-01 ENCOUNTER — Other Ambulatory Visit: Payer: Self-pay

## 2019-11-01 ENCOUNTER — Ambulatory Visit
Admission: RE | Admit: 2019-11-01 | Discharge: 2019-11-01 | Disposition: A | Discharge status: Home / Self Care | Payer: Medicare HMO | Source: Ambulatory Visit | Attending: Internal Medicine | Admitting: Internal Medicine

## 2019-11-01 DIAGNOSIS — I771 Stricture of artery: Secondary | ICD-10-CM | POA: Diagnosis not present

## 2019-11-01 DIAGNOSIS — R590 Localized enlarged lymph nodes: Secondary | ICD-10-CM | POA: Diagnosis not present

## 2019-11-01 DIAGNOSIS — R609 Edema, unspecified: Secondary | ICD-10-CM | POA: Diagnosis not present

## 2019-11-01 LAB — POCT I-STAT CREATININE: Creatinine, Ser: 0.9 mg/dL (ref 0.44–1.00)

## 2019-11-01 MED ORDER — IOHEXOL 300 MG/ML  SOLN
75.0000 mL | Freq: Once | INTRAMUSCULAR | Status: AC | PRN
  Administered 2019-11-01: 75 mL via INTRAVENOUS

## 2019-11-10 ENCOUNTER — Other Ambulatory Visit: Payer: Self-pay

## 2019-11-10 ENCOUNTER — Ambulatory Visit
Admission: RE | Admit: 2019-11-10 | Discharge: 2019-11-10 | Disposition: A | Discharge status: Home / Self Care | Payer: Medicare HMO | Source: Ambulatory Visit | Attending: Internal Medicine | Admitting: Internal Medicine

## 2019-11-10 DIAGNOSIS — Z1231 Encounter for screening mammogram for malignant neoplasm of breast: Secondary | ICD-10-CM | POA: Diagnosis not present

## 2019-11-16 DIAGNOSIS — L298 Other pruritus: Secondary | ICD-10-CM | POA: Diagnosis not present

## 2019-11-16 DIAGNOSIS — L821 Other seborrheic keratosis: Secondary | ICD-10-CM | POA: Diagnosis not present

## 2019-11-16 DIAGNOSIS — D2272 Melanocytic nevi of left lower limb, including hip: Secondary | ICD-10-CM | POA: Diagnosis not present

## 2019-11-16 DIAGNOSIS — D225 Melanocytic nevi of trunk: Secondary | ICD-10-CM | POA: Diagnosis not present

## 2019-11-16 DIAGNOSIS — L304 Erythema intertrigo: Secondary | ICD-10-CM | POA: Diagnosis not present

## 2019-11-16 DIAGNOSIS — D2261 Melanocytic nevi of right upper limb, including shoulder: Secondary | ICD-10-CM | POA: Diagnosis not present

## 2019-11-16 DIAGNOSIS — D2271 Melanocytic nevi of right lower limb, including hip: Secondary | ICD-10-CM | POA: Diagnosis not present

## 2019-11-16 DIAGNOSIS — L820 Inflamed seborrheic keratosis: Secondary | ICD-10-CM | POA: Diagnosis not present

## 2019-11-16 DIAGNOSIS — D2262 Melanocytic nevi of left upper limb, including shoulder: Secondary | ICD-10-CM | POA: Diagnosis not present

## 2019-12-23 DIAGNOSIS — E119 Type 2 diabetes mellitus without complications: Secondary | ICD-10-CM | POA: Diagnosis not present

## 2019-12-23 DIAGNOSIS — E1136 Type 2 diabetes mellitus with diabetic cataract: Secondary | ICD-10-CM | POA: Diagnosis not present

## 2020-03-30 DIAGNOSIS — E1165 Type 2 diabetes mellitus with hyperglycemia: Secondary | ICD-10-CM | POA: Diagnosis not present

## 2020-03-30 DIAGNOSIS — E7800 Pure hypercholesterolemia, unspecified: Secondary | ICD-10-CM | POA: Diagnosis not present

## 2020-03-30 DIAGNOSIS — Z0000 Encounter for general adult medical examination without abnormal findings: Secondary | ICD-10-CM | POA: Diagnosis not present

## 2020-03-30 DIAGNOSIS — R609 Edema, unspecified: Secondary | ICD-10-CM | POA: Diagnosis not present

## 2020-03-30 DIAGNOSIS — I10 Essential (primary) hypertension: Secondary | ICD-10-CM | POA: Diagnosis not present

## 2020-04-06 ENCOUNTER — Other Ambulatory Visit (HOSPITAL_COMMUNITY): Payer: Self-pay | Admitting: Internal Medicine

## 2020-04-06 ENCOUNTER — Other Ambulatory Visit: Payer: Self-pay | Admitting: Internal Medicine

## 2020-04-06 DIAGNOSIS — E785 Hyperlipidemia, unspecified: Secondary | ICD-10-CM | POA: Diagnosis not present

## 2020-04-06 DIAGNOSIS — I10 Essential (primary) hypertension: Secondary | ICD-10-CM | POA: Diagnosis not present

## 2020-04-06 DIAGNOSIS — R0989 Other specified symptoms and signs involving the circulatory and respiratory systems: Secondary | ICD-10-CM | POA: Diagnosis not present

## 2020-04-06 DIAGNOSIS — E1165 Type 2 diabetes mellitus with hyperglycemia: Secondary | ICD-10-CM

## 2020-04-06 DIAGNOSIS — Z1159 Encounter for screening for other viral diseases: Secondary | ICD-10-CM | POA: Diagnosis not present

## 2020-04-06 DIAGNOSIS — R29898 Other symptoms and signs involving the musculoskeletal system: Secondary | ICD-10-CM | POA: Diagnosis not present

## 2020-04-06 DIAGNOSIS — Z79899 Other long term (current) drug therapy: Secondary | ICD-10-CM | POA: Diagnosis not present

## 2020-04-06 DIAGNOSIS — Z0000 Encounter for general adult medical examination without abnormal findings: Secondary | ICD-10-CM | POA: Diagnosis not present

## 2020-04-06 DIAGNOSIS — E119 Type 2 diabetes mellitus without complications: Secondary | ICD-10-CM | POA: Diagnosis not present

## 2020-04-20 ENCOUNTER — Other Ambulatory Visit: Payer: Self-pay

## 2020-04-20 ENCOUNTER — Ambulatory Visit
Admission: RE | Admit: 2020-04-20 | Discharge: 2020-04-20 | Disposition: A | Discharge status: Home / Self Care | Payer: Medicare HMO | Source: Ambulatory Visit | Attending: Internal Medicine | Admitting: Internal Medicine

## 2020-04-20 DIAGNOSIS — R29898 Other symptoms and signs involving the musculoskeletal system: Secondary | ICD-10-CM | POA: Insufficient documentation

## 2020-04-20 DIAGNOSIS — E1165 Type 2 diabetes mellitus with hyperglycemia: Secondary | ICD-10-CM | POA: Diagnosis not present

## 2020-04-20 DIAGNOSIS — R531 Weakness: Secondary | ICD-10-CM | POA: Diagnosis not present

## 2020-10-29 ENCOUNTER — Other Ambulatory Visit: Payer: Self-pay | Admitting: Internal Medicine

## 2020-10-29 DIAGNOSIS — Z1231 Encounter for screening mammogram for malignant neoplasm of breast: Secondary | ICD-10-CM

## 2020-11-07 DIAGNOSIS — U071 COVID-19: Secondary | ICD-10-CM | POA: Diagnosis not present

## 2020-11-07 DIAGNOSIS — Z03818 Encounter for observation for suspected exposure to other biological agents ruled out: Secondary | ICD-10-CM | POA: Diagnosis not present

## 2020-11-07 DIAGNOSIS — J019 Acute sinusitis, unspecified: Secondary | ICD-10-CM | POA: Diagnosis not present

## 2020-11-07 DIAGNOSIS — R051 Acute cough: Secondary | ICD-10-CM | POA: Diagnosis not present

## 2020-11-12 DIAGNOSIS — B9689 Other specified bacterial agents as the cause of diseases classified elsewhere: Secondary | ICD-10-CM | POA: Diagnosis not present

## 2020-11-12 DIAGNOSIS — J019 Acute sinusitis, unspecified: Secondary | ICD-10-CM | POA: Diagnosis not present

## 2020-12-04 ENCOUNTER — Ambulatory Visit
Admission: RE | Admit: 2020-12-04 | Discharge: 2020-12-04 | Disposition: A | Discharge status: Home / Self Care | Payer: Medicare HMO | Source: Ambulatory Visit | Attending: Internal Medicine | Admitting: Internal Medicine

## 2020-12-04 ENCOUNTER — Other Ambulatory Visit: Payer: Self-pay

## 2020-12-04 DIAGNOSIS — Z1231 Encounter for screening mammogram for malignant neoplasm of breast: Secondary | ICD-10-CM | POA: Insufficient documentation

## 2020-12-07 ENCOUNTER — Other Ambulatory Visit: Payer: Self-pay | Admitting: Internal Medicine

## 2020-12-07 DIAGNOSIS — N6489 Other specified disorders of breast: Secondary | ICD-10-CM

## 2020-12-07 DIAGNOSIS — R928 Other abnormal and inconclusive findings on diagnostic imaging of breast: Secondary | ICD-10-CM

## 2020-12-21 ENCOUNTER — Ambulatory Visit
Admission: RE | Admit: 2020-12-21 | Discharge: 2020-12-21 | Disposition: A | Discharge status: Home / Self Care | Payer: Medicare HMO | Source: Ambulatory Visit | Attending: Internal Medicine | Admitting: Internal Medicine

## 2020-12-21 ENCOUNTER — Other Ambulatory Visit: Payer: Self-pay

## 2020-12-21 DIAGNOSIS — R928 Other abnormal and inconclusive findings on diagnostic imaging of breast: Secondary | ICD-10-CM

## 2020-12-21 DIAGNOSIS — N6489 Other specified disorders of breast: Secondary | ICD-10-CM | POA: Insufficient documentation

## 2020-12-21 DIAGNOSIS — R922 Inconclusive mammogram: Secondary | ICD-10-CM | POA: Diagnosis not present

## 2020-12-24 DIAGNOSIS — E119 Type 2 diabetes mellitus without complications: Secondary | ICD-10-CM | POA: Diagnosis not present

## 2020-12-24 DIAGNOSIS — Z0100 Encounter for examination of eyes and vision without abnormal findings: Secondary | ICD-10-CM | POA: Diagnosis not present

## 2021-02-14 DIAGNOSIS — Z79899 Other long term (current) drug therapy: Secondary | ICD-10-CM | POA: Diagnosis not present

## 2021-02-14 DIAGNOSIS — E1165 Type 2 diabetes mellitus with hyperglycemia: Secondary | ICD-10-CM | POA: Diagnosis not present

## 2021-02-14 DIAGNOSIS — E7800 Pure hypercholesterolemia, unspecified: Secondary | ICD-10-CM | POA: Diagnosis not present

## 2021-02-14 DIAGNOSIS — I10 Essential (primary) hypertension: Secondary | ICD-10-CM | POA: Diagnosis not present

## 2021-02-21 DIAGNOSIS — Z0000 Encounter for general adult medical examination without abnormal findings: Secondary | ICD-10-CM | POA: Diagnosis not present

## 2021-02-21 DIAGNOSIS — E785 Hyperlipidemia, unspecified: Secondary | ICD-10-CM | POA: Diagnosis not present

## 2021-02-21 DIAGNOSIS — I10 Essential (primary) hypertension: Secondary | ICD-10-CM | POA: Diagnosis not present

## 2021-02-21 DIAGNOSIS — E119 Type 2 diabetes mellitus without complications: Secondary | ICD-10-CM | POA: Diagnosis not present

## 2021-08-21 DIAGNOSIS — E1165 Type 2 diabetes mellitus with hyperglycemia: Secondary | ICD-10-CM | POA: Diagnosis not present

## 2021-08-21 DIAGNOSIS — I10 Essential (primary) hypertension: Secondary | ICD-10-CM | POA: Diagnosis not present

## 2021-08-21 DIAGNOSIS — E7800 Pure hypercholesterolemia, unspecified: Secondary | ICD-10-CM | POA: Diagnosis not present

## 2021-08-28 DIAGNOSIS — M8580 Other specified disorders of bone density and structure, unspecified site: Secondary | ICD-10-CM | POA: Diagnosis not present

## 2021-08-28 DIAGNOSIS — R29898 Other symptoms and signs involving the musculoskeletal system: Secondary | ICD-10-CM | POA: Diagnosis not present

## 2021-08-28 DIAGNOSIS — E119 Type 2 diabetes mellitus without complications: Secondary | ICD-10-CM | POA: Diagnosis not present

## 2021-08-28 DIAGNOSIS — Z13820 Encounter for screening for osteoporosis: Secondary | ICD-10-CM | POA: Diagnosis not present

## 2021-08-28 DIAGNOSIS — Z1389 Encounter for screening for other disorder: Secondary | ICD-10-CM | POA: Diagnosis not present

## 2021-08-28 DIAGNOSIS — F32A Depression, unspecified: Secondary | ICD-10-CM | POA: Diagnosis not present

## 2021-08-28 DIAGNOSIS — I10 Essential (primary) hypertension: Secondary | ICD-10-CM | POA: Diagnosis not present

## 2021-08-28 DIAGNOSIS — Z0000 Encounter for general adult medical examination without abnormal findings: Secondary | ICD-10-CM | POA: Diagnosis not present

## 2021-09-03 DIAGNOSIS — Z7689 Persons encountering health services in other specified circumstances: Secondary | ICD-10-CM | POA: Diagnosis not present

## 2021-09-03 DIAGNOSIS — R29898 Other symptoms and signs involving the musculoskeletal system: Secondary | ICD-10-CM | POA: Diagnosis not present

## 2021-09-06 DIAGNOSIS — M8588 Other specified disorders of bone density and structure, other site: Secondary | ICD-10-CM | POA: Diagnosis not present

## 2021-09-16 DIAGNOSIS — R29898 Other symptoms and signs involving the musculoskeletal system: Secondary | ICD-10-CM | POA: Diagnosis not present

## 2021-10-03 DIAGNOSIS — R29898 Other symptoms and signs involving the musculoskeletal system: Secondary | ICD-10-CM | POA: Diagnosis not present

## 2021-10-03 DIAGNOSIS — R2689 Other abnormalities of gait and mobility: Secondary | ICD-10-CM | POA: Diagnosis not present

## 2021-10-03 DIAGNOSIS — M6281 Muscle weakness (generalized): Secondary | ICD-10-CM | POA: Diagnosis not present

## 2021-10-10 DIAGNOSIS — Z23 Encounter for immunization: Secondary | ICD-10-CM | POA: Diagnosis not present

## 2021-10-25 ENCOUNTER — Other Ambulatory Visit: Payer: Self-pay | Admitting: Physician Assistant

## 2021-10-25 DIAGNOSIS — M4802 Spinal stenosis, cervical region: Secondary | ICD-10-CM

## 2021-11-08 ENCOUNTER — Other Ambulatory Visit: Payer: Self-pay | Admitting: Internal Medicine

## 2021-11-08 DIAGNOSIS — Z1231 Encounter for screening mammogram for malignant neoplasm of breast: Secondary | ICD-10-CM

## 2021-11-13 ENCOUNTER — Ambulatory Visit
Admission: RE | Admit: 2021-11-13 | Discharge: 2021-11-13 | Disposition: A | Discharge status: Home / Self Care | Payer: Medicare HMO | Source: Ambulatory Visit | Attending: Physician Assistant | Admitting: Physician Assistant

## 2021-11-13 DIAGNOSIS — M4802 Spinal stenosis, cervical region: Secondary | ICD-10-CM | POA: Diagnosis not present

## 2021-11-13 DIAGNOSIS — M47812 Spondylosis without myelopathy or radiculopathy, cervical region: Secondary | ICD-10-CM | POA: Diagnosis not present

## 2021-11-15 DIAGNOSIS — L218 Other seborrheic dermatitis: Secondary | ICD-10-CM | POA: Diagnosis not present

## 2021-11-15 DIAGNOSIS — D2261 Melanocytic nevi of right upper limb, including shoulder: Secondary | ICD-10-CM | POA: Diagnosis not present

## 2021-11-15 DIAGNOSIS — L821 Other seborrheic keratosis: Secondary | ICD-10-CM | POA: Diagnosis not present

## 2021-11-15 DIAGNOSIS — L718 Other rosacea: Secondary | ICD-10-CM | POA: Diagnosis not present

## 2021-11-15 DIAGNOSIS — D2272 Melanocytic nevi of left lower limb, including hip: Secondary | ICD-10-CM | POA: Diagnosis not present

## 2021-11-15 DIAGNOSIS — D225 Melanocytic nevi of trunk: Secondary | ICD-10-CM | POA: Diagnosis not present

## 2021-11-19 DIAGNOSIS — M4802 Spinal stenosis, cervical region: Secondary | ICD-10-CM | POA: Diagnosis not present

## 2021-11-19 DIAGNOSIS — M6281 Muscle weakness (generalized): Secondary | ICD-10-CM | POA: Diagnosis not present

## 2021-11-19 DIAGNOSIS — R29898 Other symptoms and signs involving the musculoskeletal system: Secondary | ICD-10-CM | POA: Diagnosis not present

## 2021-12-10 IMAGING — MR MR HEAD W/O CM
10 series · 48 of 48 positions shown · non-contrast
Comparison: None.

CLINICAL DATA: Bilateral hand weakness

EXAM:
MRI HEAD WITHOUT CONTRAST
TECHNIQUE: Multiplanar, multiecho pulse sequences of the brain and surrounding
structures were obtained without intravenous contrast.

[Series 5: ax dwi_tracew · axial · 3.0mm · 0.65mm/px · z∈[-80,+73]mm · 3 of 48 slices shown]
[im 1/48]
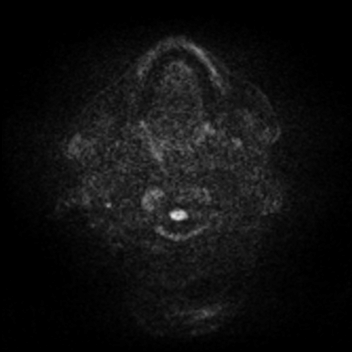
[im 24/48]
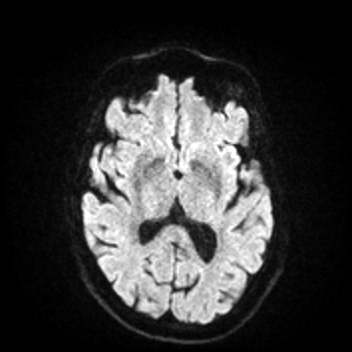
[im 48/48]
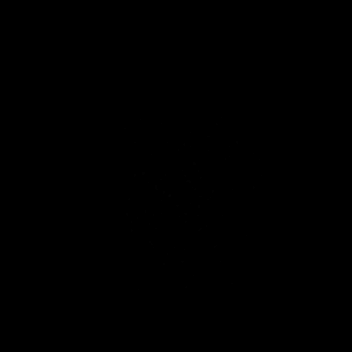

[Series 6: ax dwi_adc · axial · 3.0mm · 0.65mm/px · z∈[-80,+67]mm · 4 of 46 slices shown]
[im 1/46]
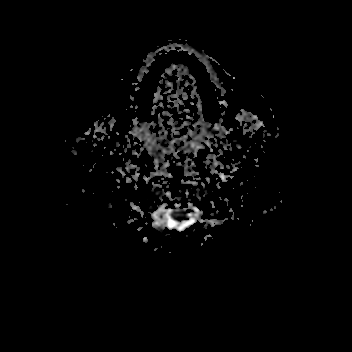
[im 16/46]
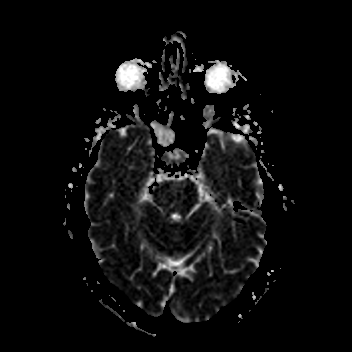
[im 31/46]
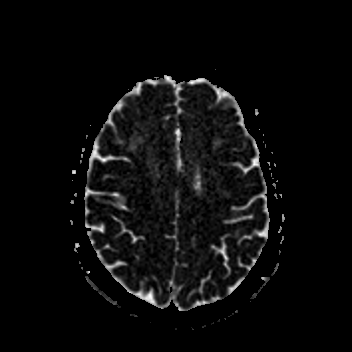
[im 46/46]
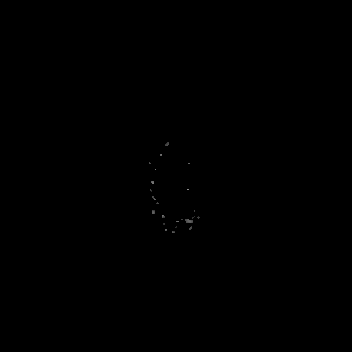

[Series 7: cor dwi_tracew · coronal · 5.0mm · 0.65mm/px · 3 of 40 slices shown]
[im 1/40]
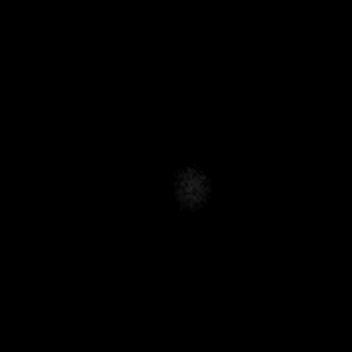
[im 20/40]
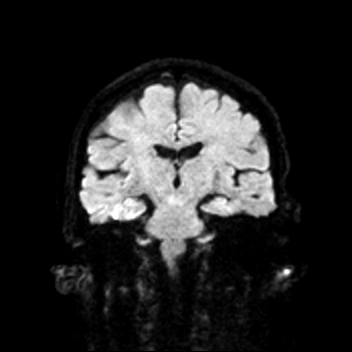
[im 40/40]
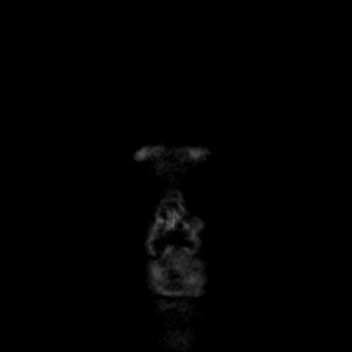

[Series 8: cor dwi_adc · coronal · 5.0mm · 0.65mm/px · 3 of 38 slices shown]
[im 1/38]
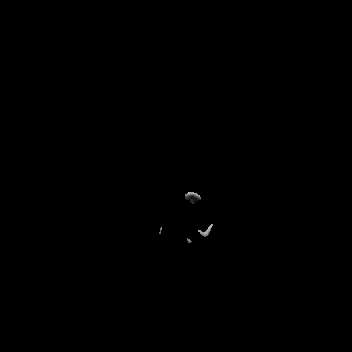
[im 19/38]
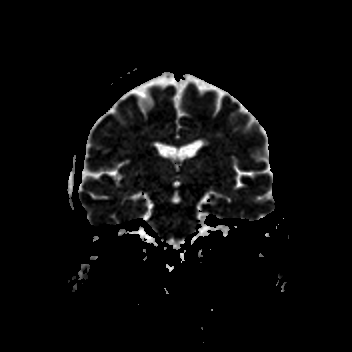
[im 38/38]
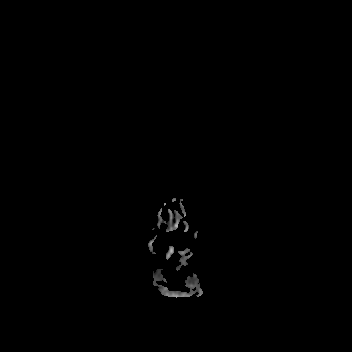

[Series 9: T1 · sagittal · 5.0mm · 0.62mm/px · 2 of 23 slices shown (1 of 2)]
[im 1/23]
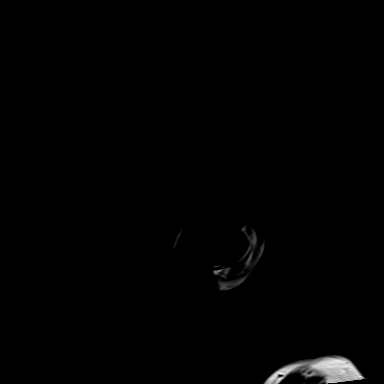
[im 23/23]
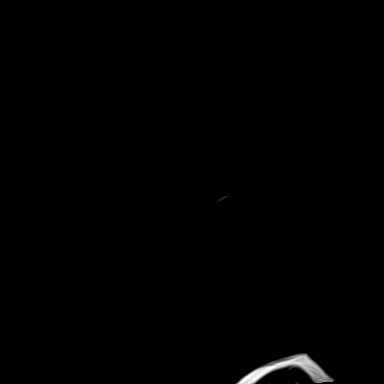

[Series 11: mag_images · axial · 3.0mm · 0.90mm/px · z∈[-88,+75]mm · 5 of 56 slices shown]
[im 1/56]
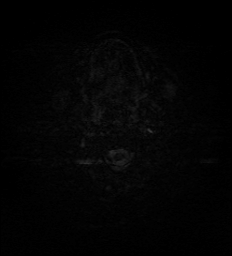
[im 14/56]
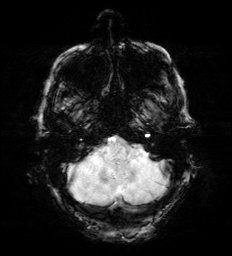
[im 28/56]
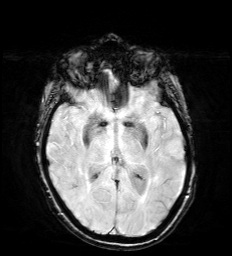
[im 42/56]
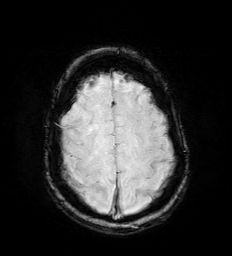
[im 56/56]
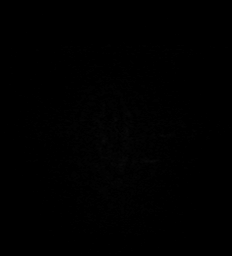

[Series 12: pha_images · axial · 3.0mm · 0.90mm/px · z∈[-88,+75]mm · 5 of 55 slices shown]
[im 1/55]
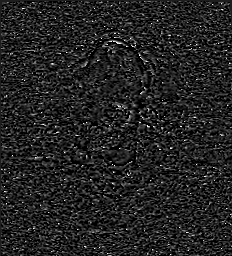
[im 14/55]
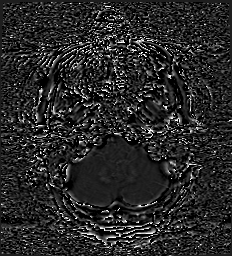
[im 28/55]
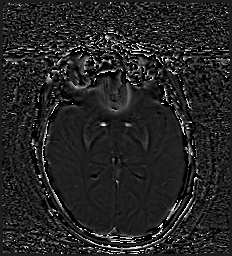
[im 41/55]
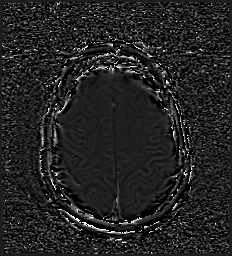
[im 55/55]
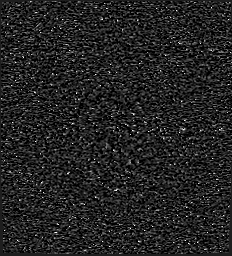

[Series 13: swi_images · axial · 3.0mm · 0.90mm/px · z∈[-88,+75]mm · 5 of 56 slices shown]
[im 1/56]
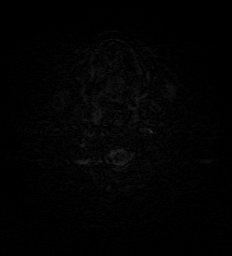
[im 14/56]
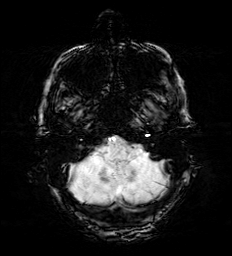
[im 28/56]
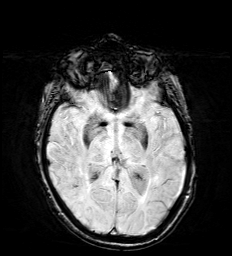
[im 42/56]
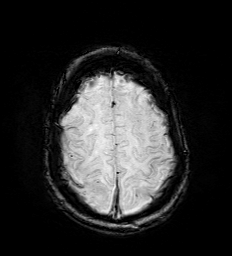
[im 56/56]
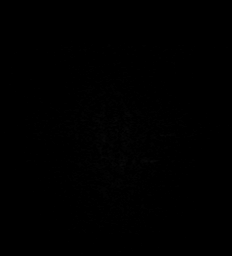

[Series 15: FLAIR · axial · 3.0mm · 0.53mm/px · z∈[-87,+73]mm · 5 of 55 slices shown]
[im 1/55]
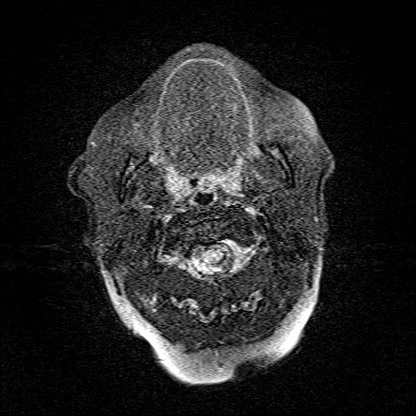
[im 14/55]
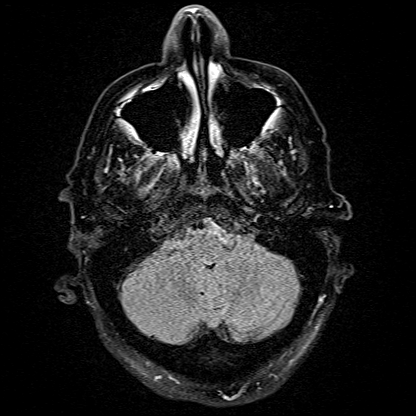
[im 28/55]
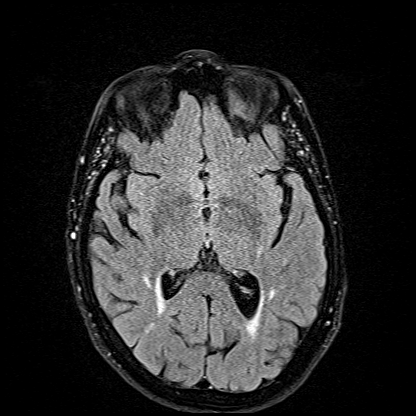
[im 41/55]
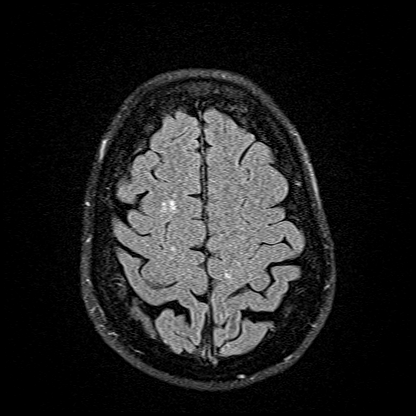
[im 55/55]
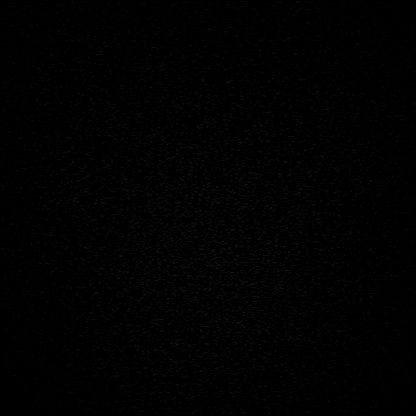

[Series 16: T1 · axial · 1.0mm · 0.98mm/px · z∈[-81,+77]mm · 13 of 160 slices shown (2 of 2)]
[im 1/160]
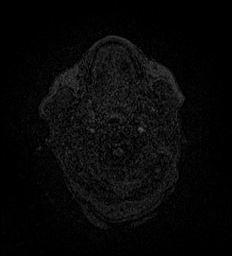
[im 14/160]
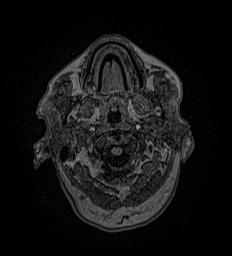
[im 27/160]
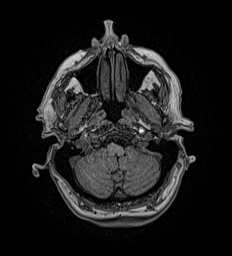
[im 40/160]
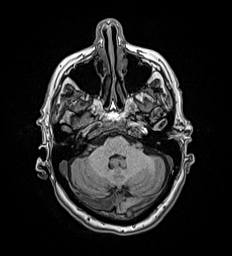
[im 54/160]
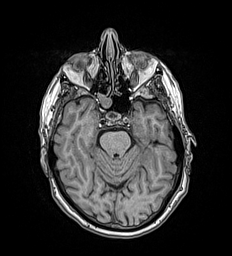
[im 67/160]
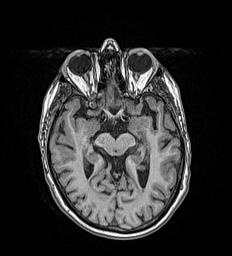
[im 80/160]
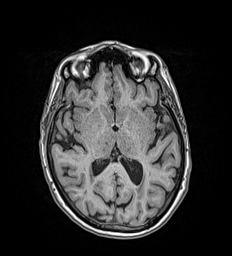
[im 93/160]
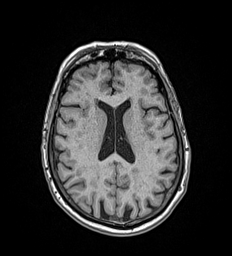
[im 107/160]
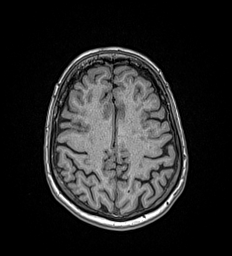
[im 120/160]
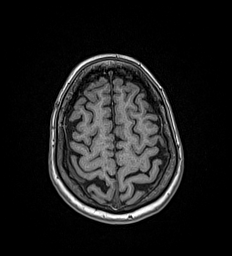
[im 133/160]
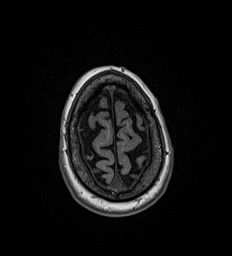
[im 146/160]
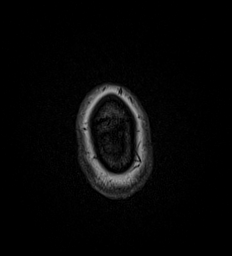
[im 160/160]
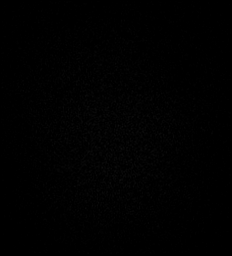

[48 of 48 positions shown; findings below may reference images not displayed]

FINDINGS: Brain: No acute infarct, mass effect or extra-axial collection. No
acute or chronic hemorrhage. There is multifocal hyperintense
T2-weighted signal within the white matter. Parenchymal volume and
CSF spaces are normal. The midline structures are normal.

Vascular: Major flow voids are preserved.

Skull and upper cervical spine: Normal calvarium and skull base.
Visualized upper cervical spine and soft tissues are normal.

Sinuses/Orbits:No paranasal sinus fluid levels or advanced mucosal
thickening. No mastoid or middle ear effusion. Normal orbits.
IMPRESSION: 1. No acute intracranial abnormality.
2. Findings of chronic small vessel ischemia.

## 2021-12-11 DIAGNOSIS — M189 Osteoarthritis of first carpometacarpal joint, unspecified: Secondary | ICD-10-CM | POA: Diagnosis not present

## 2021-12-11 DIAGNOSIS — M79642 Pain in left hand: Secondary | ICD-10-CM | POA: Diagnosis not present

## 2021-12-11 DIAGNOSIS — M79641 Pain in right hand: Secondary | ICD-10-CM | POA: Diagnosis not present

## 2021-12-11 DIAGNOSIS — R29898 Other symptoms and signs involving the musculoskeletal system: Secondary | ICD-10-CM | POA: Diagnosis not present

## 2021-12-17 ENCOUNTER — Ambulatory Visit
Admission: RE | Admit: 2021-12-17 | Discharge: 2021-12-17 | Disposition: A | Discharge status: Home / Self Care | Payer: Medicare HMO | Source: Ambulatory Visit | Attending: Internal Medicine | Admitting: Internal Medicine

## 2021-12-17 DIAGNOSIS — Z1231 Encounter for screening mammogram for malignant neoplasm of breast: Secondary | ICD-10-CM | POA: Insufficient documentation

## 2021-12-20 DIAGNOSIS — R29898 Other symptoms and signs involving the musculoskeletal system: Secondary | ICD-10-CM | POA: Diagnosis not present

## 2021-12-26 DIAGNOSIS — H524 Presbyopia: Secondary | ICD-10-CM | POA: Diagnosis not present

## 2021-12-26 DIAGNOSIS — E119 Type 2 diabetes mellitus without complications: Secondary | ICD-10-CM | POA: Diagnosis not present

## 2021-12-31 DIAGNOSIS — R531 Weakness: Secondary | ICD-10-CM | POA: Diagnosis not present

## 2022-01-02 DIAGNOSIS — R29898 Other symptoms and signs involving the musculoskeletal system: Secondary | ICD-10-CM | POA: Diagnosis not present

## 2022-01-22 DIAGNOSIS — M47816 Spondylosis without myelopathy or radiculopathy, lumbar region: Secondary | ICD-10-CM | POA: Diagnosis not present

## 2022-01-22 DIAGNOSIS — G959 Disease of spinal cord, unspecified: Secondary | ICD-10-CM | POA: Diagnosis not present

## 2022-01-22 DIAGNOSIS — M4312 Spondylolisthesis, cervical region: Secondary | ICD-10-CM | POA: Diagnosis not present

## 2022-01-22 DIAGNOSIS — M21372 Foot drop, left foot: Secondary | ICD-10-CM | POA: Diagnosis not present

## 2022-01-22 DIAGNOSIS — R531 Weakness: Secondary | ICD-10-CM | POA: Diagnosis not present

## 2022-01-22 DIAGNOSIS — M4722 Other spondylosis with radiculopathy, cervical region: Secondary | ICD-10-CM | POA: Diagnosis not present

## 2022-02-19 DIAGNOSIS — R29898 Other symptoms and signs involving the musculoskeletal system: Secondary | ICD-10-CM | POA: Diagnosis not present

## 2022-02-20 DIAGNOSIS — M5126 Other intervertebral disc displacement, lumbar region: Secondary | ICD-10-CM | POA: Diagnosis not present

## 2022-02-20 DIAGNOSIS — M48061 Spinal stenosis, lumbar region without neurogenic claudication: Secondary | ICD-10-CM | POA: Diagnosis not present

## 2022-02-24 DIAGNOSIS — M810 Age-related osteoporosis without current pathological fracture: Secondary | ICD-10-CM | POA: Diagnosis not present

## 2022-02-24 DIAGNOSIS — I10 Essential (primary) hypertension: Secondary | ICD-10-CM | POA: Diagnosis not present

## 2022-02-24 DIAGNOSIS — E1165 Type 2 diabetes mellitus with hyperglycemia: Secondary | ICD-10-CM | POA: Diagnosis not present

## 2022-02-24 DIAGNOSIS — Z780 Asymptomatic menopausal state: Secondary | ICD-10-CM | POA: Diagnosis not present

## 2022-02-24 DIAGNOSIS — R29898 Other symptoms and signs involving the musculoskeletal system: Secondary | ICD-10-CM | POA: Diagnosis not present

## 2022-02-24 DIAGNOSIS — J302 Other seasonal allergic rhinitis: Secondary | ICD-10-CM | POA: Diagnosis not present

## 2022-03-03 DIAGNOSIS — Z6827 Body mass index (BMI) 27.0-27.9, adult: Secondary | ICD-10-CM | POA: Diagnosis not present

## 2022-03-03 DIAGNOSIS — M6281 Muscle weakness (generalized): Secondary | ICD-10-CM | POA: Diagnosis not present

## 2022-03-03 DIAGNOSIS — M8580 Other specified disorders of bone density and structure, unspecified site: Secondary | ICD-10-CM | POA: Diagnosis not present

## 2022-03-03 DIAGNOSIS — E785 Hyperlipidemia, unspecified: Secondary | ICD-10-CM | POA: Diagnosis not present

## 2022-03-03 DIAGNOSIS — Z0000 Encounter for general adult medical examination without abnormal findings: Secondary | ICD-10-CM | POA: Diagnosis not present

## 2022-03-03 DIAGNOSIS — I10 Essential (primary) hypertension: Secondary | ICD-10-CM | POA: Diagnosis not present

## 2022-03-03 DIAGNOSIS — E119 Type 2 diabetes mellitus without complications: Secondary | ICD-10-CM | POA: Diagnosis not present

## 2022-03-08 DIAGNOSIS — G959 Disease of spinal cord, unspecified: Secondary | ICD-10-CM | POA: Diagnosis not present

## 2022-03-08 DIAGNOSIS — M47816 Spondylosis without myelopathy or radiculopathy, lumbar region: Secondary | ICD-10-CM | POA: Diagnosis not present

## 2022-03-08 DIAGNOSIS — M21372 Foot drop, left foot: Secondary | ICD-10-CM | POA: Diagnosis not present

## 2022-03-19 DIAGNOSIS — G5732 Lesion of lateral popliteal nerve, left lower limb: Secondary | ICD-10-CM | POA: Diagnosis not present

## 2022-03-19 DIAGNOSIS — R29898 Other symptoms and signs involving the musculoskeletal system: Secondary | ICD-10-CM | POA: Diagnosis not present

## 2022-04-25 DIAGNOSIS — M5416 Radiculopathy, lumbar region: Secondary | ICD-10-CM | POA: Diagnosis not present

## 2022-04-25 DIAGNOSIS — M5412 Radiculopathy, cervical region: Secondary | ICD-10-CM | POA: Diagnosis not present

## 2022-04-30 DIAGNOSIS — R29898 Other symptoms and signs involving the musculoskeletal system: Secondary | ICD-10-CM | POA: Diagnosis not present

## 2022-06-25 DIAGNOSIS — J019 Acute sinusitis, unspecified: Secondary | ICD-10-CM | POA: Diagnosis not present

## 2022-07-05 DIAGNOSIS — J019 Acute sinusitis, unspecified: Secondary | ICD-10-CM | POA: Diagnosis not present

## 2022-08-13 DIAGNOSIS — R29898 Other symptoms and signs involving the musculoskeletal system: Secondary | ICD-10-CM | POA: Diagnosis not present

## 2022-08-25 DIAGNOSIS — E1165 Type 2 diabetes mellitus with hyperglycemia: Secondary | ICD-10-CM | POA: Diagnosis not present

## 2022-08-25 DIAGNOSIS — M8589 Other specified disorders of bone density and structure, multiple sites: Secondary | ICD-10-CM | POA: Diagnosis not present

## 2022-08-25 DIAGNOSIS — E7800 Pure hypercholesterolemia, unspecified: Secondary | ICD-10-CM | POA: Diagnosis not present

## 2022-08-25 DIAGNOSIS — M6281 Muscle weakness (generalized): Secondary | ICD-10-CM | POA: Diagnosis not present

## 2022-08-25 DIAGNOSIS — I10 Essential (primary) hypertension: Secondary | ICD-10-CM | POA: Diagnosis not present

## 2022-09-01 DIAGNOSIS — M47812 Spondylosis without myelopathy or radiculopathy, cervical region: Secondary | ICD-10-CM | POA: Diagnosis not present

## 2022-09-01 DIAGNOSIS — R29898 Other symptoms and signs involving the musculoskeletal system: Secondary | ICD-10-CM | POA: Diagnosis not present

## 2022-09-01 DIAGNOSIS — E119 Type 2 diabetes mellitus without complications: Secondary | ICD-10-CM | POA: Diagnosis not present

## 2022-09-01 DIAGNOSIS — I10 Essential (primary) hypertension: Secondary | ICD-10-CM | POA: Diagnosis not present

## 2022-09-24 DIAGNOSIS — M6281 Muscle weakness (generalized): Secondary | ICD-10-CM | POA: Diagnosis not present

## 2022-09-24 DIAGNOSIS — G7241 Inclusion body myositis [IBM]: Secondary | ICD-10-CM | POA: Diagnosis not present

## 2022-09-24 DIAGNOSIS — R29898 Other symptoms and signs involving the musculoskeletal system: Secondary | ICD-10-CM | POA: Diagnosis not present

## 2022-10-17 DIAGNOSIS — Z23 Encounter for immunization: Secondary | ICD-10-CM | POA: Diagnosis not present

## 2022-11-19 DIAGNOSIS — L304 Erythema intertrigo: Secondary | ICD-10-CM | POA: Diagnosis not present

## 2022-11-19 DIAGNOSIS — L821 Other seborrheic keratosis: Secondary | ICD-10-CM | POA: Diagnosis not present

## 2022-11-19 DIAGNOSIS — L718 Other rosacea: Secondary | ICD-10-CM | POA: Diagnosis not present

## 2022-11-19 DIAGNOSIS — L218 Other seborrheic dermatitis: Secondary | ICD-10-CM | POA: Diagnosis not present

## 2022-11-20 ENCOUNTER — Other Ambulatory Visit: Payer: Self-pay | Admitting: Internal Medicine

## 2022-11-20 DIAGNOSIS — Z1231 Encounter for screening mammogram for malignant neoplasm of breast: Secondary | ICD-10-CM

## 2022-12-23 ENCOUNTER — Ambulatory Visit
Admission: RE | Admit: 2022-12-23 | Discharge: 2022-12-23 | Disposition: A | Discharge status: Home / Self Care | Payer: Medicare HMO | Source: Ambulatory Visit | Attending: Internal Medicine | Admitting: Internal Medicine

## 2022-12-23 DIAGNOSIS — Z1231 Encounter for screening mammogram for malignant neoplasm of breast: Secondary | ICD-10-CM | POA: Diagnosis not present

## 2022-12-25 ENCOUNTER — Encounter: Payer: Self-pay | Admitting: Emergency Medicine

## 2022-12-25 ENCOUNTER — Other Ambulatory Visit: Payer: Self-pay

## 2022-12-25 ENCOUNTER — Emergency Department: Payer: Medicare HMO

## 2022-12-25 ENCOUNTER — Emergency Department
Admission: EM | Admit: 2022-12-25 | Discharge: 2022-12-26 | Disposition: A | Discharge status: Home / Self Care | Payer: Medicare HMO | Attending: Emergency Medicine | Admitting: Emergency Medicine

## 2022-12-25 DIAGNOSIS — S0003XA Contusion of scalp, initial encounter: Secondary | ICD-10-CM | POA: Insufficient documentation

## 2022-12-25 DIAGNOSIS — W01198A Fall on same level from slipping, tripping and stumbling with subsequent striking against other object, initial encounter: Secondary | ICD-10-CM | POA: Insufficient documentation

## 2022-12-25 DIAGNOSIS — S2242XA Multiple fractures of ribs, left side, initial encounter for closed fracture: Secondary | ICD-10-CM | POA: Diagnosis not present

## 2022-12-25 DIAGNOSIS — I10 Essential (primary) hypertension: Secondary | ICD-10-CM | POA: Diagnosis not present

## 2022-12-25 DIAGNOSIS — S199XXA Unspecified injury of neck, initial encounter: Secondary | ICD-10-CM | POA: Diagnosis not present

## 2022-12-25 DIAGNOSIS — K449 Diaphragmatic hernia without obstruction or gangrene: Secondary | ICD-10-CM | POA: Diagnosis not present

## 2022-12-25 DIAGNOSIS — W19XXXA Unspecified fall, initial encounter: Secondary | ICD-10-CM

## 2022-12-25 DIAGNOSIS — S0990XA Unspecified injury of head, initial encounter: Secondary | ICD-10-CM | POA: Diagnosis not present

## 2022-12-25 DIAGNOSIS — S299XXA Unspecified injury of thorax, initial encounter: Secondary | ICD-10-CM | POA: Diagnosis present

## 2022-12-25 DIAGNOSIS — S2232XA Fracture of one rib, left side, initial encounter for closed fracture: Secondary | ICD-10-CM | POA: Diagnosis not present

## 2022-12-25 MED ORDER — ACETAMINOPHEN 500 MG PO TABS
1000.0000 mg | ORAL_TABLET | Freq: Once | ORAL | Status: AC
  Administered 2022-12-25: 1000 mg via ORAL
  Filled 2022-12-25: qty 2

## 2022-12-25 MED ORDER — LIDOCAINE 5 % EX PTCH
1.0000 | MEDICATED_PATCH | CUTANEOUS | Status: DC
  Administered 2022-12-25: 1 via TRANSDERMAL
  Filled 2022-12-25: qty 1

## 2022-12-25 MED ORDER — LIDOCAINE 5 % EX PTCH: 1.0000 | MEDICATED_PATCH | Freq: Two times a day (BID) | CUTANEOUS | 0 refills | Status: AC

## 2022-12-25 MED ORDER — OXYCODONE-ACETAMINOPHEN 5-325 MG PO TABS: 1.0000 | ORAL_TABLET | ORAL | 0 refills | Status: AC | PRN

## 2022-12-25 NOTE — ED Notes (Signed)
Wound cleaned with NS and dressed using xeroform, gauze and covered with coban prior to departure.  Pt also given an incentive spirometer, pt educated about proper usage and frequency.

## 2022-12-25 NOTE — ED Provider Notes (Signed)
[facility] Provider Note    Event Date/Time   First MD Initiated Contact with Patient 12/25/22 2142     (approximate)   History   Chief Complaint Fall   HPI  Kim Hayes is a 77 y.o. female with past medical history of hypertension who presents to the ED complaining of fall.  Patient reports that just prior to arrival she tripped over some cement and fell forward, striking her face as well as her left chest and left hand.  She denies losing consciousness, primarily complains of pain over her left chest, especially when taking a deep breath.  She denies feeling short of breath and does not have any headache or neck pain.  She does not take any blood thinners beyond a daily baby aspirin.  She has been ambulatory since the fall, denies any pain in her abdomen or extremities.     Physical Exam   Triage Vital Signs: ED Triage Vitals [12/25/22 1800]  Encounter Vitals Group     BP (!) 213/88     Systolic BP Percentile      Diastolic BP Percentile      Pulse Rate 83     Resp 18     Temp 97.7 F (36.5 C)     Temp Source Oral     SpO2 94 %     Weight 160 lb (72.6 kg)     Height 5\' 4"  (1.626 m)     Head Circumference      Peak Flow      Pain Score 7     Pain Loc      Pain Education      Exclude from Growth Chart     Most recent vital signs: Vitals:   12/25/22 1800 12/25/22 2203  BP: (!) 213/88   Pulse: 83 79  Resp: 18 17  Temp: 97.7 F (36.5 C) 98 F (36.7 C)  SpO2: 94%     Constitutional: Alert and oriented. Eyes: Conjunctivae are normal. Head: Small area of ecchymosis over left frontal scalp, no hematomas or step-offs. Nose: No congestion/rhinnorhea. Mouth/Throat: Mucous membranes are moist.  Neck: No midline cervical spine tenderness to palpation. Cardiovascular: Normal rate, regular rhythm. Grossly normal heart sounds.  2+ radial pulses bilaterally. Respiratory: Normal respiratory effort.  No retractions. Lungs CTAB.  Left chest  wall tenderness to palpation noted. Gastrointestinal: Soft and nontender. No distention. Musculoskeletal: No lower extremity tenderness nor edema.  No upper extremity bony tenderness to palpation. Neurologic:  Normal speech and language. No gross focal neurologic deficits are appreciated.    ED Results / Procedures / Treatments   Labs (all labs ordered are listed, but only abnormal results are displayed) Labs Reviewed - No data to display  RADIOLOGY Chest x-ray reviewed and interpreted by me with second and third rib fractures on the left, no pneumothorax.  PROCEDURES:  Critical Care performed: No  Procedures   MEDICATIONS ORDERED IN ED: Medications  lidocaine (LIDODERM) 5 % 1 patch (1 patch Transdermal Patch Applied 12/25/22 2221)  acetaminophen (TYLENOL) tablet 1,000 mg (1,000 mg Oral Given 12/25/22 2220)     IMPRESSION / MDM / ASSESSMENT AND PLAN / ED COURSE  I reviewed the triage vital signs and the nursing notes.                              77 y.o. female with past medical history of hypertension who presents to  the ED following trip and fall onto concrete, striking her head and left chest wall.  Patient's presentation is most consistent with acute complicated illness / injury requiring diagnostic workup.  Differential diagnosis includes, but is not limited to, intracranial injury, cervical spine injury, rib fracture, hemothorax, pneumothorax.  Patient nontoxic-appearing and in no acute distress, vital signs are remarkable for hypertension but otherwise reassuring.  Left rib x-ray were performed from triage and shows fractures of second and third ribs on the left, no pneumothorax noted.  We will further assess with CT imaging of her head and cervical spine, no evidence of traumatic injury to her abdomen or extremities.  Patient was offered a dose of oxycodone for pain, but declines and prefers to trial dose of Tylenol and Lidoderm patch for her rib fractures.  Plan to  reassess following CT imaging.  Patient turned over to on, provider pending CT results and reassessment, anticipate that she will be appropriate for discharge home and I have prescribed pain medication for her rib fractures, patient also provided with incentive spirometer.      FINAL CLINICAL IMPRESSION(S) / ED DIAGNOSES   Final diagnoses:  Fall, initial encounter  Closed fracture of multiple ribs of left side, initial encounter     Rx / DC Orders   ED Discharge Orders          Ordered    oxyCODONE-acetaminophen (PERCOCET) 5-325 MG tablet  Every 4 hours PRN        12/25/22 2318    lidocaine (LIDODERM) 5 %  Every 12 hours        12/25/22 2318             Note:  This document was prepared using Dragon voice recognition software and may include unintentional dictation errors.   Chesley Noon, MD 12/25/22 2320

## 2022-12-25 NOTE — ED Triage Notes (Signed)
Patient to ED via POV for a mechanical fall. Pt states she tripped over cement and landed face forward. Abrasion noted to left eyelid and left hand. States she land on face and chest. Hurts in left ribs when taking a deep breath and into her back. Takes a daily aspirin but denies LOC.

## 2022-12-25 NOTE — Discharge Instructions (Addendum)
Use Tylenol for pain and fevers.  Up to 1000 mg per dose, up to 4 times per day.  Do not take more than 4000 mg of Tylenol/acetaminophen within 24 hours.. ° °Please use lidocaine patches at your site of pain.  Apply 1 patch at a time, leave on for 12 hours, then remove for 12 hours.  12 hours on, 12 hours off.  Do not apply more than 1 patch at a time. ° °

## 2023-01-01 DIAGNOSIS — H2513 Age-related nuclear cataract, bilateral: Secondary | ICD-10-CM | POA: Diagnosis not present

## 2023-01-01 DIAGNOSIS — E119 Type 2 diabetes mellitus without complications: Secondary | ICD-10-CM | POA: Diagnosis not present

## 2023-01-01 DIAGNOSIS — M3501 Sicca syndrome with keratoconjunctivitis: Secondary | ICD-10-CM | POA: Diagnosis not present

## 2023-01-01 DIAGNOSIS — Z0100 Encounter for examination of eyes and vision without abnormal findings: Secondary | ICD-10-CM | POA: Diagnosis not present

## 2023-09-15 ENCOUNTER — Ambulatory Visit: Attending: Internal Medicine

## 2023-09-15 DIAGNOSIS — M6281 Muscle weakness (generalized): Secondary | ICD-10-CM | POA: Insufficient documentation

## 2023-09-15 DIAGNOSIS — R29898 Other symptoms and signs involving the musculoskeletal system: Secondary | ICD-10-CM | POA: Insufficient documentation

## 2023-09-15 DIAGNOSIS — R2689 Other abnormalities of gait and mobility: Secondary | ICD-10-CM | POA: Diagnosis present

## 2023-09-15 DIAGNOSIS — R262 Difficulty in walking, not elsewhere classified: Secondary | ICD-10-CM | POA: Diagnosis present

## 2023-09-15 NOTE — Therapy (Signed)
 OUTPATIENT PHYSICAL THERAPY NEURO EVALUATION   Patient Name: Kim Hayes MRN: [id number] DOB:[birth year], 78 y.o., female Today's Date: 09/16/2023   PCP: Sadie Manna, MD  REFERRING PROVIDER: Sadie Manna, MD   END OF SESSION:  PT End of Session - 09/15/23 1020     Visit Number 1    Number of Visits 25    Date for PT Re-Evaluation 12/08/23    PT Start Time 1016    PT Stop Time 1100    PT Time Calculation (min) 44 min          Past Medical History:  Diagnosis Date   Hypertension    History reviewed. No pertinent surgical history. There are no active problems to display for this patient.   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  Pt just started wearing the brace ~3-4 months ago as it was advised by an MD with [facility] in [place].    Pt is seeking therapy for the foot drop to see if she can improve it with therapy.  Pt reports that she first started having difficulty with grasping items with the UE's and then eventually progressed to having foot drop in the L side.  Pt reports the MD did educated her slightly on the body myositis diagnosis and that it would eventually cause her muscles to give out.   Pt accompanied by: self  PERTINENT HISTORY: Pt has hx of HTN and new diagnosis of body myositis.  PAIN:  Are you having pain? No  PRECAUTIONS:  None  RED FLAGS: None   WEIGHT BEARING RESTRICTIONS: No  FALLS: Has patient fallen in last 6 months? Yes. Number of falls 1.  Pt reports she was unable to pick the L foot up in December and she broke some of her ribs during the fall.  LIVING ENVIRONMENT: Lives with: lives with their spouse Lives in: House/apartment Stairs: Pt reports having one step from the porch into the living room. Has following equipment at home: Single point cane and  Walker - 2 wheeled  PLOF: Independent  PATIENT GOALS: Strengthen muscles in order to improve walking ability.  OBJECTIVE:  Note: Objective measures were completed at Evaluation unless otherwise noted.  DIAGNOSTIC FINDINGS:   EXAM: CT HEAD WITHOUT CONTRAST  IMPRESSION: 1. No acute intracranial abnormality. 2. Chronic ischemic microangiopathy. 3. No acute fracture or static subluxation of the cervical spine.  COGNITION: Overall cognitive status: Within functional limits for tasks assessed   SENSATION: WFL  COORDINATION: WFL  POSTURE: rounded shoulders and forward head  LOWER EXTREMITY ROM:     Active  Right Eval Left Eval  Hip flexion    Hip extension    Hip abduction    Hip adduction    Hip internal rotation    Hip external rotation    Knee flexion    Knee extension    Ankle dorsiflexion    Ankle plantarflexion    Ankle inversion    Ankle eversion     (Blank rows = not tested)  LOWER EXTREMITY MMT:    MMT Right Eval Left Eval  Hip flexion 4- 4-  Hip extension    Hip abduction 4+ 4+  Hip adduction 4- 4-  Hip internal rotation    Hip external rotation    Knee flexion 4+ 4  Knee extension 4+ 4  Ankle dorsiflexion 4- 2+  Ankle plantarflexion    Ankle inversion    Ankle eversion    (Blank rows = not tested)  BED MOBILITY:  Not tested  GAIT: Findings: Pt ambulates well with the use of the AFO, however would be beneficial to see ambulation attempt without the AFO.    FUNCTIONAL TESTS:  5 times sit to  stand: 13.20 sec Timed up and go (TUG): 10.18 sec 6 minute walk test: TBD 10 meter walk test: 10.60 sec Functional gait assessment: TBD  PATIENT SURVEYS:  FADI: 92/104 = 88%                                                                                                                              TREATMENT DATE: 09/16/23  Evaluation:   Self-Care Home Management:  Pt received education on the prognosis of the therapy following the conditions that the pt currently presents with.  Pt received education on the role of PT and the necessary buy in from pt to perform tasks at home in between sessions of therapy to achieve maximal potential.  Pt understanding of this and agreeable to performing exercises at home as part of HEP.  Pt also informed of method of interventions in order to improve overall endurance and strength levels to improve balance necessary for reduced fall risk.     PATIENT EDUCATION: Education details: Pt educated on role of PT and services provided during current POC, along with prognosis and information about the clinic. Person educated: Patient Education method: Explanation, Demonstration, and Tactile cues Education comprehension: verbalized understanding  HOME EXERCISE PROGRAM:  Access Code: [email or web address] URL:  [email or web address] Date: 09/15/2023 Prepared by: Sidra Simpers  Exercises - Seated Ankle Alphabet  - 1 x daily - 7 x weekly - 2 sets - 10 reps - 5 hold - Single Leg Heel Raise  - 1 x daily - 7 x weekly - 2 sets - 10 reps - 5 hold - Standing Ankle Dorsiflexion with Table Support  - 1 x daily - 7 x weekly - 2 sets - 10 reps - 5 hold - Single Leg Stance  - 1 x daily - 7 x weekly - 2 sets - 2 reps - 30 hold - Seated Ankle Dorsiflexion with Resistance  - 1 x daily - 7 x weekly - 2 sets - 10 reps - 5 hold - Seated Ankle Eversion with Resistance  - 1 x daily - 7 x weekly - 2 sets - 10 reps - 5 hold - Seated Ankle Plantar Flexion with Resistance Loop  -  1 x daily - 7 x weekly - 2 sets - 10 reps - 5 hold - Seated Ankle Inversion with Resistance  - 1 x daily - 7 x weekly - 2 sets - 10 reps - 5 hold  GOALS: Goals reviewed with patient? Yes  SHORT TERM GOALS: Target date: 10/13/2023  Pt will be independent with HEP in order to demonstrate increased ability to perform tasks related to occupation/hobbies. Baseline: HEP generated at evaluation Goal status: INITIAL   LONG TERM GOALS: Target date: 12/08/2023  1.  Patient (> 41 years old) will complete five times sit to stand test in < 12 seconds indicating an increased LE strength and improved balance. Baseline: 13.2 sec Goal status: INITIAL  2.  Patient will increase FADI score to equal to or greater than  94%   to demonstrate statistically significant improvement in mobility and quality of life.  Baseline: 88% Goal status: INITIAL   3.  Patient will increase FGA score by > 4 points to demonstrate decreased fall risk during functional activities. Baseline: TBD at next visit Goal status: INITIAL   4.  Patient will reduce timed up and go to <10 seconds to reduce fall risk and demonstrate improved transfer/gait ability. Baseline: 10.18 Goal status: INITIAL  5.  Patient will increase 10 meter walk test to >1.46m/s as to improve gait speed for better community ambulation and to reduce fall risk. Baseline: 10.6 sec; 0.94 m/s Goal status: INITIAL  6.  Patient will increase six minute walk test distance to >1000 for progression to community ambulator and improve gait ability Baseline: TBD Goal status: INITIAL      ASSESSMENT:  CLINICAL IMPRESSION: Patient is a 78 y.o. female who was seen today for physical therapy evaluation and treatment for L foot drop.  Pt currently able to ambulate well with the use of an AFO, however pt and MD are hoping that pt will be able to discontinue the use of the device.  Pt noted to have muscular weakness more distally in both the UE's and LE's and will  continue to benefit from strengthening of the LE's in order to prevent muscular wasting.  Pt was also given referral for OT in hopes that she can strengthen the hands necessary for everyday tasks.  Pt will benefit from skilled therapy to address tolerance, balance, and strength impairments necessary for improvement in quality of life.  Pt. demonstrates understanding of this plan of care and agrees with this plan.   OBJECTIVE IMPAIRMENTS: Abnormal gait, decreased activity tolerance, decreased balance, decreased endurance, decreased mobility, difficulty walking,  decreased ROM, and decreased strength.   ACTIVITY LIMITATIONS: carrying, lifting, squatting, and bathing  PARTICIPATION LIMITATIONS: meal prep, cleaning, laundry, personal finances, shopping, community activity, and yard work  PERSONAL FACTORS: Time since onset of injury/illness/exacerbation and 1-2 comorbidities: HTN, DM2, Inclusion body myositis are also affecting patient's functional outcome.   REHAB POTENTIAL: Good  CLINICAL DECISION MAKING: Stable/uncomplicated  EVALUATION COMPLEXITY: Low  PLAN:  PT FREQUENCY: 2x/week  PT DURATION: 12 weeks  PLANNED INTERVENTIONS: 97750- Physical Performance Testing, 97110-Therapeutic exercises, 97530- Therapeutic activity, 97112- Neuromuscular re-education, 97535- Self Care, [id number]- Manual therapy, [phone]- Gait training, [phone]- Electrical stimulation (manual), [phone] (1-2 muscles), [id number] (3+ muscles)- Dry Needling, Balance training, and Stair training  PLAN FOR NEXT SESSION:   FGA Assess ROM of the ankle without the brace Assess gait mechanics without the L AFO as well   Fonda Simpers, PT, DPT Physical Therapist - [facility]  09/16/23, 10:58 AM

## 2023-09-17 ENCOUNTER — Ambulatory Visit: Admitting: Physical Therapy

## 2023-09-17 DIAGNOSIS — R29898 Other symptoms and signs involving the musculoskeletal system: Secondary | ICD-10-CM | POA: Diagnosis not present

## 2023-09-17 DIAGNOSIS — R2689 Other abnormalities of gait and mobility: Secondary | ICD-10-CM

## 2023-09-17 DIAGNOSIS — M6281 Muscle weakness (generalized): Secondary | ICD-10-CM

## 2023-09-17 DIAGNOSIS — R262 Difficulty in walking, not elsewhere classified: Secondary | ICD-10-CM

## 2023-09-17 NOTE — Therapy (Signed)
 OUTPATIENT PHYSICAL THERAPY NEURO TREATMENT   Patient Name: Kim Hayes MRN: [id number] DOB:[birth year], 78 y.o., female Today's Date: 09/17/2023   PCP: Sadie Manna, MD  REFERRING PROVIDER: Sadie Manna, MD   END OF SESSION:   PT End of Session - 09/17/23 1030     Visit Number 2    Number of Visits 25    Date for PT Re-Evaluation 12/08/23    PT Start Time 0937    PT Stop Time 1020    PT Time Calculation (min) 43 min    Equipment Utilized During Treatment Gait belt    Activity Tolerance Patient tolerated treatment well    Behavior During Therapy WFL for tasks assessed/performed           Past Medical History:  Diagnosis Date   Hypertension    No past surgical history on file. There are no active problems to display for this patient.   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 Pt reported that she was not given an HEP at evaluation.    From Initial Eval: Pt reports that she has been having some L foot drop for approximately one year.  Pt just started wearing the brace ~3-4 months ago as it was advised by an MD with [facility] in [place].    Pt is seeking therapy for the foot drop to see if she can improve it with therapy.  Pt reports that she first started having difficulty with grasping items with the UE's and then eventually progressed to having foot drop in the L side.   Pt reports the MD did educated her slightly on the body myositis diagnosis and that it would eventually cause her muscles to give out.   Pt accompanied by: self  PERTINENT HISTORY: Pt has hx of HTN and new diagnosis of body myositis.  PAIN:  Are you having pain? No  PRECAUTIONS: None  RED FLAGS: None   WEIGHT BEARING RESTRICTIONS: No  FALLS: Has patient fallen in last 6 months? Yes. Number of falls  1.  Pt reports she was unable to pick the L foot up in December and she broke some of her ribs during the fall.  LIVING ENVIRONMENT: Lives with: lives with their spouse Lives in: House/apartment Stairs: Pt reports having one step from the porch into the living room. Has following equipment at home: Single point cane and Walker - 2 wheeled  PLOF: Independent  PATIENT GOALS: Strengthen muscles in order to improve walking ability.  OBJECTIVE:  Note: Objective measures were completed at Evaluation unless otherwise noted.  DIAGNOSTIC FINDINGS:   EXAM: CT HEAD WITHOUT CONTRAST  IMPRESSION: 1. No acute intracranial abnormality. 2. Chronic ischemic microangiopathy. 3. No acute fracture or static subluxation of the cervical spine.  COGNITION: Overall cognitive status: Within functional limits for tasks assessed   SENSATION: WFL  COORDINATION: WFL  POSTURE: rounded shoulders and forward head  LOWER EXTREMITY ROM:     Active  Right Eval Left Eval  Hip flexion    Hip extension    Hip abduction    Hip adduction    Hip internal rotation    Hip external rotation    Knee flexion    Knee extension    Ankle dorsiflexion    Ankle plantarflexion    Ankle inversion    Ankle eversion     (Blank rows = not tested)  LOWER EXTREMITY MMT:    MMT Right Eval Left Eval  Hip flexion 4- 4-  Hip extension    Hip abduction 4+ 4+  Hip adduction 4- 4-  Hip internal rotation    Hip external rotation    Knee flexion 4+ 4  Knee extension 4+ 4  Ankle dorsiflexion 4- 2+  Ankle  plantarflexion    Ankle inversion    Ankle eversion    (Blank rows = not tested)  BED MOBILITY:  Not tested  GAIT: Findings: Pt ambulates well with the use of the AFO, however would be beneficial to see ambulation attempt without the AFO.    FUNCTIONAL TESTS:  5 times sit to stand: 13.20 sec Timed up and go (TUG): 10.18 sec 6 minute walk test: TBD 10 meter walk test: 10.60 sec Functional gait assessment: TBD  PATIENT SURVEYS:  FADI: 92/104 = 88%                                                                                                                              TREATMENT DATE: 09/17/23  Ankle ROM:  Passive: firm end feel without significant limitations noted in DF/PF, inversion, eversion.  Active: Decreased active DF on LLE; trace muscle activation noted upon palpation    Gait mechanics without the L AFO:  Foot drop noted on LLE; no significant knee hyperextension or Trendelenberg gait noted Pt reported that she feels less confident in gait without brace d/t history of falls when ambulating without AFO  6 Min Walk Test:  Instructed patient to ambulate as quickly and as safely as possible for  6 minutes using LRAD. Patient was allowed to take standing rest breaks without stopping the test, but if the patient required a sitting rest break the clock would be stopped and the test would be over.  Results: 1243 feet (378.8 meters, Avg speed 1.05 m/s) L AFO donned, no AD used, with close SBA-CGA. Results indicate that the patient has reduced endurance with ambulation compared to age matched norms.  Age Matched Norms: 36-69 yo M: 70 F: 73, 35-79 yo M: 51 F: 471, 98-89 yo M: 417 F: 392 MDC: 58.21 meters (190.98 feet) or 50 meters (ANPTA Core Set of Outcome Measures for Adults with Neurologic Conditions, 2018)    FGA: completed with L AFO donned, no AD 12/30; see flowsheet.  Difficulty noted with stepping over object, stair navigation, gait with eyes closed, gait with narrow  BOS, gait with head turns, gait with pivot turn. Of note, pt reports that stairs & stepping over objects are where she felt least confident   [facility] PT Assessment - 09/17/23 0001       Functional Gait  Assessment   Gait assessed  Yes    Gait Level Surface Walks 20 ft in less than 7 sec but greater than 5.5 sec, uses assistive device, slower speed, mild gait deviations, or deviates 6-10 in outside of the 12 in walkway width.    Change in Gait Speed Makes only minor adjustments to walking speed, or accomplishes a change in speed with significant gait deviations, deviates 10-15 in outside the 12 in walkway width, or changes speed but loses balance but is able to recover and continue walking.    Gait with Horizontal Head Turns Performs head turns with moderate changes in gait velocity, slows down, deviates 10-15 in outside 12 in walkway width but recovers, can continue to walk.    Gait with Vertical Head Turns Performs task with moderate change in gait velocity, slows down, deviates 10-15 in outside 12 in walkway width but recovers, can continue to walk.    Gait and Pivot Turn Pivot turns safely in greater than 3 sec and stops with no loss of balance, or pivot turns safely within 3 sec and stops with mild imbalance, requires small steps to catch balance.    Step Over Obstacle Is able to step over one shoe box (4.5 in total height) but must slow down and adjust steps to clear box safely. May require verbal cueing.    Gait with Narrow Base of Support Ambulates less than 4 steps heel to toe or cannot perform without assistance.    Gait with Eyes Closed Walks 20 ft, slow speed, abnormal gait pattern, evidence for imbalance, deviates 10-15 in outside 12 in walkway width. Requires more than 9 sec to ambulate 20 ft.    Ambulating Backwards Walks 20 ft, slow speed, abnormal gait pattern, evidence for imbalance, deviates 10-15 in outside 12 in walkway width.    Steps Alternating feet, must use rail.    Total  Score 12            Pt educated on benefit of HEP; pt verbalizing understanding & willingness to complete. Reviewed exercises as seen in HEP section below; handout & YTB provided.      PATIENT EDUCATION: Education details: Pt educated on role of PT and services provided during current POC, along with prognosis and information about the clinic. Person educated: Patient Education method: Explanation, Demonstration, and Tactile cues Education comprehension: verbalized understanding  HOME EXERCISE PROGRAM:  Access Code: [email or web address] URL:  [email or web address] Date: 09/15/2023 Prepared by: Sidra Simpers  Exercises - Seated Ankle Alphabet  - 1 x daily - 7 x weekly - 2 sets - 10 reps - 5 hold - Single Leg Heel Raise  - 1 x daily - 7 x weekly - 2 sets - 10 reps - 5 hold - Standing Ankle Dorsiflexion with Table Support  - 1 x daily - 7 x weekly - 2 sets - 10 reps - 5 hold - Single Leg Stance  - 1 x daily - 7 x weekly - 2 sets - 2 reps - 30 hold - Seated Ankle Dorsiflexion with Resistance  - 1 x daily - 7 x weekly - 2 sets - 10 reps - 5 hold - Seated Ankle Eversion with Resistance  - 1 x daily - 7 x weekly - 2 sets - 10 reps - 5 hold - Seated Ankle Plantar Flexion with Resistance Loop  - 1 x daily - 7 x weekly - 2 sets - 10 reps - 5 hold - Seated Ankle Inversion with Resistance  - 1 x daily - 7 x weekly - 2 sets - 10 reps - 5 hold  GOALS: Goals reviewed with patient? Yes  SHORT TERM GOALS: Target date: 10/13/2023  Pt will be independent with HEP in order to demonstrate increased ability to perform tasks related to occupation/hobbies. Baseline: HEP generated at evaluation Goal status: INITIAL   LONG TERM GOALS: Target date: 12/08/2023  1.  Patient (> 78 years old) will complete five times sit to stand test in < 12 seconds indicating an increased LE strength and improved balance. Baseline: 13.2 sec Goal status: INITIAL  2.  Patient will increase FADI score to equal to or  greater than  94%   to demonstrate statistically significant improvement in mobility and quality of life.  Baseline: 88% Goal status: INITIAL   3.  Patient will increase FGA score by > 4 points to demonstrate decreased fall risk during functional activities. Baseline: 12/30 Goal status: INITIAL   4.  Patient will reduce timed up and go to <10 seconds to reduce fall risk and demonstrate improved transfer/gait ability. Baseline: 10.18 Goal status: INITIAL  5.  Patient will increase 10 meter walk test to >1.31m/s as to improve gait speed for better community ambulation and to reduce fall risk. Baseline: 10.6 sec; 0.94 m/s Goal status: INITIAL  6.  Patient will increase six minute walk test distance to 392 m for progression to meet age matched norm and improve gait ability Baseline: 1243 feet (378.8 meters, Avg speed 1.05 m/s) Goal status: INITIAL      ASSESSMENT:  CLINICAL IMPRESSION:  Patient is a 78 y.o. female who was seen today for physical therapy treatment for L foot drop. Today's session focused on continuing to assess pt's impairments & implement HEP. Pt demonstrating deficits in endurance & dynamic balance, particularly with stepping over object/stair navigation, gait with eyes closed, gait with narrow BOS, gait with head turns, & gait with pivot turn. Pt will benefit from skilled therapy to address tolerance, balance, and strength impairments necessary for improvement in quality of life.  Pt. demonstrates understanding of this plan of care and agrees with this plan.   OBJECTIVE IMPAIRMENTS: Abnormal gait, decreased activity tolerance, decreased balance, decreased endurance, decreased mobility, difficulty walking, decreased ROM, and decreased strength.   ACTIVITY LIMITATIONS: carrying, lifting, squatting, and bathing  PARTICIPATION LIMITATIONS: meal prep, cleaning, laundry, personal finances, shopping, community activity, and yard work  PERSONAL FACTORS: Time since onset  of  injury/illness/exacerbation and 1-2 comorbidities: HTN, DM2, Inclusion body myositis are also affecting patient's functional outcome.   REHAB POTENTIAL: Good  CLINICAL DECISION MAKING: Stable/uncomplicated  EVALUATION COMPLEXITY: Low  PLAN:  PT FREQUENCY: 2x/week  PT DURATION: 12 weeks  PLANNED INTERVENTIONS: 97750- Physical Performance Testing, 97110-Therapeutic exercises, 97530- Therapeutic activity, 97112- Neuromuscular re-education, 97535- Self Care, 02859- Manual therapy, [phone]- Gait training, [phone]- Electrical stimulation (manual), [phone] (1-2 muscles), 20561 (3+ muscles)- Dry Needling, Balance training, and Stair training  PLAN FOR NEXT SESSION:  Assess HEP adherence; adjust as appropriate Have pt demonstrate understanding of exercises Dynamic balance interventions stepping over object, stair navigation, gait with eyes closed, gait with narrow BOS, gait with head turns, gait with pivot turn LE strengthening   Chiquita Silvan, SPT Physical Therapy Student - [facility]  [facility]  10:32 AM 09/17/23

## 2023-09-22 ENCOUNTER — Ambulatory Visit: Attending: Internal Medicine

## 2023-09-22 DIAGNOSIS — R29898 Other symptoms and signs involving the musculoskeletal system: Secondary | ICD-10-CM | POA: Insufficient documentation

## 2023-09-22 DIAGNOSIS — R2689 Other abnormalities of gait and mobility: Secondary | ICD-10-CM | POA: Insufficient documentation

## 2023-09-22 DIAGNOSIS — R262 Difficulty in walking, not elsewhere classified: Secondary | ICD-10-CM | POA: Insufficient documentation

## 2023-09-22 DIAGNOSIS — M6281 Muscle weakness (generalized): Secondary | ICD-10-CM | POA: Diagnosis present

## 2023-09-22 DIAGNOSIS — R278 Other lack of coordination: Secondary | ICD-10-CM | POA: Diagnosis present

## 2023-09-22 NOTE — Therapy (Unsigned)
 OUTPATIENT PHYSICAL THERAPY NEURO TREATMENT   Patient Name: Kim Hayes MRN: [id number] DOB:[birth year], 78 y.o., female Today's Date: 09/23/2023   PCP: Sadie Manna, MD  REFERRING PROVIDER: Sadie Manna, MD   END OF SESSION:   PT End of Session - 09/22/23 1318     Visit Number 3    Number of Visits 25    Date for PT Re-Evaluation 12/08/23    PT Start Time 1319    PT Stop Time 1400    PT Time Calculation (min) 41 min    Equipment Utilized During Treatment Gait belt    Activity Tolerance Patient tolerated treatment well    Behavior During Therapy WFL for tasks assessed/performed           Past Medical History:  Diagnosis Date   Hypertension    No past surgical history on file. There are no active problems to display for this patient.   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  Pt otherwise reports no pain and is doing well.     From Initial Eval: Pt reports that she has been having some L foot drop for approximately one year.  Pt just started wearing the brace ~3-4 months ago as it was advised by an MD with [facility] in [place].    Pt is seeking therapy for the foot drop to see if she can improve it with therapy.  Pt reports that she first started having difficulty with grasping items with the UE's and then eventually progressed to having foot  drop in the L side.  Pt reports the MD did educated her slightly on the body myositis diagnosis and that it would eventually cause her muscles to give out.   Pt accompanied by: self  PERTINENT HISTORY: Pt has hx of HTN and new diagnosis of body myositis.  PAIN:  Are you having pain? No  PRECAUTIONS: None  RED FLAGS: None   WEIGHT BEARING RESTRICTIONS: No  FALLS: Has patient fallen in last 6 months?  Yes. Number of falls 1.  Pt reports she was unable to pick the L foot up in December and she broke some of her ribs during the fall.  LIVING ENVIRONMENT: Lives with: lives with their spouse Lives in: House/apartment Stairs: Pt reports having one step from the porch into the living room. Has following equipment at home: Single point cane and Walker - 2 wheeled  PLOF: Independent  PATIENT GOALS: Strengthen muscles in order to improve walking ability.  OBJECTIVE:  Note: Objective measures were completed at Evaluation unless otherwise noted.  DIAGNOSTIC FINDINGS:   EXAM: CT HEAD WITHOUT CONTRAST  IMPRESSION: 1. No acute intracranial abnormality. 2. Chronic ischemic microangiopathy. 3. No acute fracture or static subluxation of the cervical spine.  COGNITION: Overall cognitive status: Within functional limits for tasks assessed   SENSATION: WFL  COORDINATION: WFL  POSTURE: rounded shoulders and forward head  LOWER EXTREMITY ROM:     Active  Right Eval Left Eval  Hip flexion    Hip extension    Hip abduction    Hip adduction    Hip internal rotation    Hip external rotation    Knee flexion    Knee extension    Ankle dorsiflexion    Ankle plantarflexion    Ankle inversion    Ankle eversion     (Blank rows = not tested)  LOWER EXTREMITY MMT:    MMT Right Eval Left Eval  Hip flexion 4- 4-  Hip extension    Hip abduction 4+ 4+  Hip adduction 4- 4-  Hip internal rotation    Hip external rotation    Knee flexion 4+ 4  Knee extension 4+ 4  Ankle  dorsiflexion 4- 2+  Ankle plantarflexion    Ankle inversion    Ankle eversion    (Blank rows = not tested)  BED MOBILITY:  Not tested  GAIT: Findings: Pt ambulates well with the use of the AFO, however would be beneficial to see ambulation attempt without the AFO.    FUNCTIONAL TESTS:  5 times sit to stand: 13.20 sec Timed up and go (TUG): 10.18 sec 6 minute walk test: TBD 10 meter walk test: 10.60 sec Functional gait assessment: TBD  PATIENT SURVEYS:  FADI: 92/104 = 88%                                                                                                                              TREATMENT DATE: 09/23/23   TherEx:  Seated ankle inversion/eversion on towel with no AFO, 2x10 each direction Seated heel/toe raises, trace dorsiflexion noted, 2x10 each direction Seated toe splaying, 2x10 Seated great toe extensions, 2x10 Seated arch pull ups, 2x10 Seated towel scrunches, 60 sec bouts x2  Gait:  Ambulation around the gym, down by the cafeteria and down EVS hallway before returning to the gym (~500') without AFO for gait assessment and verbal cues to perform increased dorsiflexion and hip flexion to prevent scuffing of  the L LE.     Neuro:  Ambulation with eyes closed down main hallway, length of hallway x2 Ambulation with tandem gait pattern to promote balance, however unsteady and is only able to perform half the length of the large hallway Retro ambulation in hallway, length of hallway x2 with verbal and visual demonstration of proper form Ambulation with head turns in hallway with post it note recall, length of hallway x2       Pt educated on benefit of HEP; pt verbalizing understanding & willingness to complete. Reviewed exercises as seen in HEP section below; handout & YTB provided.      PATIENT EDUCATION: Education details: Pt educated on role of PT and services provided during current POC, along with prognosis and information about the  clinic. Person educated: Patient Education method: Explanation, Demonstration, and Tactile cues Education comprehension: verbalized understanding  HOME EXERCISE PROGRAM:  Access Code: [email or web address] URL: [email or web address] Date: 09/15/2023 Prepared by: Sidra Simpers  Exercises - Seated Ankle Alphabet  - 1 x daily - 7 x weekly - 2 sets - 10 reps - 5 hold - Single Leg Heel Raise  - 1 x daily - 7 x weekly - 2 sets - 10 reps - 5 hold - Standing Ankle Dorsiflexion with Table Support  - 1 x daily - 7 x weekly - 2 sets - 10 reps - 5 hold - Single Leg Stance  - 1 x daily - 7 x weekly - 2 sets - 2 reps - 30 hold - Seated Ankle Dorsiflexion with Resistance  - 1 x daily - 7 x weekly - 2 sets - 10 reps - 5 hold - Seated Ankle Eversion with Resistance  - 1 x daily - 7 x weekly - 2 sets - 10 reps - 5 hold - Seated Ankle Plantar Flexion with Resistance Loop  - 1 x daily - 7 x weekly - 2 sets - 10 reps - 5 hold - Seated Ankle Inversion with Resistance  - 1 x daily - 7 x weekly - 2 sets - 10 reps - 5 hold  GOALS: Goals reviewed with patient? Yes  SHORT TERM GOALS: Target date: 10/13/2023  Pt will be independent with HEP in order to demonstrate increased ability to perform tasks related to occupation/hobbies. Baseline: HEP generated at evaluation Goal status: INITIAL   LONG TERM GOALS: Target date: 12/08/2023  1.  Patient (> 54 years old) will complete five times sit to stand test in < 12 seconds indicating an increased LE strength and improved balance. Baseline: 13.2 sec Goal status: INITIAL  2.  Patient will increase FADI score to equal to or greater than  94%   to demonstrate statistically significant improvement in mobility and quality of life.  Baseline: 88% Goal status: INITIAL   3.  Patient will increase FGA score by > 4 points to demonstrate decreased fall risk during functional activities. Baseline: 12/30 Goal status: INITIAL   4.  Patient will reduce timed up and go to  <10 seconds to reduce fall risk and demonstrate improved transfer/gait ability. Baseline: 10.18 Goal status: INITIAL  5.  Patient will increase 10 meter walk test to >1.55m/s as to improve gait speed for better community ambulation and to reduce fall risk. Baseline: 10.6 sec; 0.94 m/s Goal status: INITIAL  6.  Patient will increase six minute walk test distance to 392 m for progression to meet age matched norm and improve gait ability Baseline: 1243 feet (378.8 meters, Avg speed 1.05 m/s) Goal status:  INITIAL      ASSESSMENT:  CLINICAL IMPRESSION:   Pt performed well without the use of the ADO with no signs of instability other than when performing tandem walking.  Pt is self-aware of the deficit she has when not wearing the AFO and makes a conscious effort to lift the foot and hip to provide enough clearance for the foot.  Pt will continue to benefit from continued strengthening of the LE along with more dynamic stability/balance related exercises in the subsequent sessions.     OBJECTIVE IMPAIRMENTS: Abnormal gait, decreased activity tolerance, decreased balance, decreased endurance, decreased mobility, difficulty walking, decreased ROM, and decreased strength.   ACTIVITY LIMITATIONS: carrying, lifting, squatting, and bathing  PARTICIPATION LIMITATIONS: meal prep, cleaning, laundry, personal finances, shopping, community activity, and yard work  PERSONAL FACTORS: Time since onset of injury/illness/exacerbation and 1-2 comorbidities: HTN, DM2, Inclusion body myositis are also affecting patient's functional outcome.   REHAB POTENTIAL: Good  CLINICAL DECISION MAKING: Stable/uncomplicated  EVALUATION COMPLEXITY: Low  PLAN:  PT FREQUENCY: 2x/week  PT DURATION: 12 weeks  PLANNED INTERVENTIONS: 97750- Physical Performance Testing, 97110-Therapeutic exercises, 97530- Therapeutic activity, 97112- Neuromuscular re-education, 97535- Self Care, 02859- Manual therapy, [phone]- Gait  training, [phone]- Electrical stimulation (manual), [phone] (1-2 muscles), 20561 (3+ muscles)- Dry Needling, Balance training, and Stair training  PLAN FOR NEXT SESSION:   Assess HEP adherence; adjust as appropriate Have pt demonstrate understanding of exercises Dynamic balance interventions stepping over object, stair navigation, gait with eyes closed, gait with narrow BOS, gait with head turns, gait with pivot turn LE strengthening   Fonda Simpers, PT, DPT Physical Therapist - [facility]  [facility]  09/23/23, 9:05 AM

## 2023-09-24 ENCOUNTER — Ambulatory Visit: Admitting: Occupational Therapy

## 2023-09-24 ENCOUNTER — Ambulatory Visit: Admitting: Physical Therapy

## 2023-09-24 DIAGNOSIS — R262 Difficulty in walking, not elsewhere classified: Secondary | ICD-10-CM

## 2023-09-24 DIAGNOSIS — R29898 Other symptoms and signs involving the musculoskeletal system: Secondary | ICD-10-CM

## 2023-09-24 DIAGNOSIS — R2689 Other abnormalities of gait and mobility: Secondary | ICD-10-CM

## 2023-09-24 DIAGNOSIS — M6281 Muscle weakness (generalized): Secondary | ICD-10-CM

## 2023-09-24 NOTE — Therapy (Signed)
 OUTPATIENT PHYSICAL THERAPY NEURO TREATMENT   Patient Name: Kim Hayes MRN: [id number] DOB:1945/09/02, 78 y.o., female Today's Date: 09/24/2023   PCP: Sadie Manna, MD  REFERRING PROVIDER: Sadie Manna, MD   END OF SESSION:    PT End of Session - 09/24/23 1530     Visit Number 4    Number of Visits 25    Date for PT Re-Evaluation 12/08/23    PT Start Time 1532    PT Stop Time 1615    PT Time Calculation (min) 43 min    Equipment Utilized During Treatment Gait belt    Activity Tolerance Patient tolerated treatment well    Behavior During Therapy WFL for tasks assessed/performed            Past Medical History:  Diagnosis Date   Hypertension    No past surgical history on file. There are no active problems to display for this patient.   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 Pt denies pain; patient denies any updates regarding medications or medical history.   Pt states that she has been doing some of her exercises at home, but they have been challenging. Pt states that drawing the alphabet has been the most difficult.   Pt reported that foot & ankle exercises at previous session were fairly difficult.    From Initial Eval: Pt reports that she has been having some L foot drop for approximately one year.  Pt just started wearing the brace ~3-4 months ago as  it was advised by an MD with [facility] in [place].    Pt is seeking therapy for the foot drop to see if she can improve it with therapy.  Pt reports that she first started having difficulty with grasping items with the UE's and then eventually progressed to having foot drop in the L side.  Pt reports the MD did educated her slightly on the body myositis diagnosis and that it would eventually cause her muscles to give out.  Pt accompanied by: self  PERTINENT HISTORY: Pt has hx of HTN and new diagnosis of body myositis.  PAIN:  Are you having pain? No  PRECAUTIONS: None  RED FLAGS: None   WEIGHT BEARING RESTRICTIONS: No  FALLS: Has patient fallen in last 6 months? Yes. Number of falls 1.  Pt reports she was unable to pick the L foot up in December and she broke some of her ribs during the fall.  LIVING ENVIRONMENT: Lives with: lives with their spouse Lives in: House/apartment Stairs: Pt reports having one step from the porch into the living room. Has following equipment at home: Single point cane and Walker - 2 wheeled  PLOF: Independent  PATIENT GOALS: Strengthen muscles in order to improve walking ability.  OBJECTIVE:  Note: Objective measures were completed at Evaluation unless otherwise noted.  DIAGNOSTIC FINDINGS:   EXAM: CT HEAD WITHOUT CONTRAST  IMPRESSION: 1. No acute intracranial abnormality. 2. Chronic ischemic microangiopathy. 3. No acute fracture or static subluxation of the cervical spine.  COGNITION: Overall cognitive status: Within functional limits for tasks assessed   SENSATION: WFL  COORDINATION: WFL  POSTURE: rounded shoulders and forward head  LOWER EXTREMITY ROM:     Active  Right Eval Left Eval  Hip flexion    Hip extension    Hip abduction    Hip adduction    Hip internal rotation    Hip external rotation    Knee flexion    Knee extension    Ankle dorsiflexion    Ankle plantarflexion    Ankle inversion     Ankle eversion     (Blank rows = not tested)  LOWER EXTREMITY MMT:    MMT Right Eval Left Eval  Hip flexion 4- 4-  Hip extension    Hip abduction 4+ 4+  Hip adduction 4- 4-  Hip internal rotation    Hip external rotation    Knee flexion 4+ 4  Knee extension 4+ 4  Ankle dorsiflexion 4- 2+  Ankle plantarflexion    Ankle inversion    Ankle eversion    (Blank rows = not tested)  BED MOBILITY:  Not tested  GAIT: Findings: Pt ambulates well with the use of the AFO, however would be beneficial to see ambulation attempt without the AFO.    FUNCTIONAL TESTS:  5 times sit to stand: 13.20 sec Timed up and go (TUG): 10.18 sec 6 minute walk test: TBD 10 meter walk test: 10.60 sec Functional gait assessment: TBD  PATIENT SURVEYS:  FADI: 92/104 = 88%                                                                                                                              TREATMENT DATE: 09/24/23  Reviewed HEP to assess form; all performed without AFO donned:  Alphabet with ankle, 10x Trace movement noted throughout; increased difficulty noted with exercise. Ankle DF, 10x  resistance removed d/t trace movement noted Ankle eversion,  10x resistance removed d/t trace movement noted Ankle inversion with yellow theraband Completed 10x seated with feet dangling; pt experiencing difficulty holding theraband with current positioning Trialed ankle inversion with ankle at opposite knee, seated in figure 4 position 10x with increased success Ankle PF with yellow theraband, 10x  Single leg heel raise x10 each with hands at bar Increased difficulty w/ LLE Standing toe raises 10x  with hands at bar R>L SLS with hands at bar, 30 sec. Each LE Increased difficulty w/ LLE Pt provided new handout of exercises to reflect changes; pt verbalizing understanding.    Completed following therapeutic activities to promote increased SLS time & functional strength needed for improved foot  clearance; AFO donned for following activities:  Standing at bar for UE support, stepping over 1/2 foam roll, 2x10 each LE max verbal cueing for landing with heel strike, decreased posterior weight shift when stepping back to return to starting position Increased difficulty noted w/ LLE Stair navigation  4 steps ascending & descending w/ BUE support Pt with step through pattern 2x8 steps ascending & descending w/ unilat UE support Pt with step through pattern Increased difficulty when descending stairs  Pt educated throughout session regarding benefit of HEP & benefit of activities in therapy to improve balance & strength.       PATIENT EDUCATION: Education details: Pt educated on role of PT and services provided during current POC, along with prognosis and information about the clinic. Person educated: Patient Education method: Explanation, Demonstration, and Tactile cues Education comprehension: verbalized understanding  HOME EXERCISE PROGRAM: Access Code: [email or web address] URL: [email or web address] Date: 09/24/2023 Prepared by: Chiquita Silvan  Exercises - Seated Ankle Alphabet  - 1 x daily - 7 x weekly - 2 sets - 10 reps - 5 hold - Single Leg Heel Raise  - 1 x daily - 7 x weekly - 2 sets - 10 reps - 5 hold - Standing Ankle Dorsiflexion with Table Support  - 1 x daily - 7 x weekly - 2 sets - 10 reps - 5 hold - Single Leg Stance  - 1 x daily - 7 x weekly - 2 sets - 2 reps - 30 hold - Seated Ankle Plantar Flexion with Resistance Loop  - 1 x daily - 7 x weekly - 2 sets - 10 reps - 5 hold - Seated Ankle Inversion with Resistance  - 1 x daily - 7 x weekly - 2 sets - 10 reps - 5 hold - Seated Toe Raise  - 1 x daily - 7 x weekly - 3 sets - 10 reps - Supine Ankle Eversion AROM  - 1 x daily - 7 x weekly - 3 sets - 10 reps  GOALS: Goals reviewed with patient? Yes  SHORT TERM GOALS: Target date: 10/13/2023  Pt will be independent with HEP in order to demonstrate increased ability  to perform tasks related to occupation/hobbies. Baseline: HEP generated at evaluation Goal status: INITIAL   LONG TERM GOALS: Target date: 12/08/2023  1.  Patient (> 17 years old) will complete five times sit to stand test in < 12 seconds indicating an increased LE strength and improved balance. Baseline: 13.2 sec Goal status: INITIAL  2.  Patient will increase FADI score to equal to or greater than  94%   to demonstrate statistically significant improvement in mobility and quality of life.  Baseline: 88% Goal status: INITIAL   3.  Patient will increase FGA score by > 4 points to demonstrate decreased fall risk during  functional activities. Baseline: 12/30 Goal status: INITIAL   4.  Patient will reduce timed up and go to <10 seconds to reduce fall risk and demonstrate improved transfer/gait ability. Baseline: 10.18 Goal status: INITIAL  5.  Patient will increase 10 meter walk test to >1.16m/s as to improve gait speed for better community ambulation and to reduce fall risk. Baseline: 10.6 sec; 0.94 m/s Goal status: INITIAL  6.  Patient will increase six minute walk test distance to 392 m for progression to meet age matched norm and improve gait ability Baseline: 1243 feet (378.8 meters, Avg speed 1.05 m/s) Goal status: INITIAL      ASSESSMENT:  CLINICAL IMPRESSION:    Pt performed HEP exercises with verbal cueing for technique& exercises displayed. Pt demonstrating difficulty with descending stairs with decreased UE support at railing; however, able to progress within session from BUE support to unilat UE support this date without LOB or indications of instability. Pt continues to be self-aware of the deficit she has when not wearing the AFO and is motivated to participate in therapy, learn about services.  Pt will continue to benefit from continued strengthening of the LE along with more dynamic stability/balance related exercises in the subsequent sessions.     OBJECTIVE  IMPAIRMENTS: Abnormal gait, decreased activity tolerance, decreased balance, decreased endurance, decreased mobility, difficulty walking, decreased ROM, and decreased strength.   ACTIVITY LIMITATIONS: carrying, lifting, squatting, and bathing  PARTICIPATION LIMITATIONS: meal prep, cleaning, laundry, personal finances, shopping, community activity, and yard work  PERSONAL FACTORS: Time since onset of injury/illness/exacerbation and 1-2 comorbidities: HTN, DM2, Inclusion body myositis are also affecting patient's functional outcome.   REHAB POTENTIAL: Good  CLINICAL DECISION MAKING: Stable/uncomplicated  EVALUATION COMPLEXITY: Low  PLAN:  PT FREQUENCY: 2x/week  PT DURATION: 12 weeks  PLANNED INTERVENTIONS: 97750- Physical Performance Testing, 97110-Therapeutic exercises, 97530- Therapeutic activity, [id number]- Neuromuscular re-education, 97535- Self Care, [id number]- Manual therapy, [id number]- Gait training, [id number]- Electrical stimulation (manual), [phone] (1-2 muscles), 20561 (3+ muscles)- Dry Needling, Balance training, and Stair training  PLAN FOR NEXT SESSION:   Continue to assess HEP adherence; adjust as appropriate Condense HEP? Have pt perform exercises to assess form Dynamic balance interventions stepping over object, stair navigation, gait with eyes closed, gait with narrow BOS, gait with head turns, gait with pivot turn LE strengthening   Chiquita Silvan, SPT Physical Therapy Student - [facility]  [facility]  09/24/23, 5:50 PM

## 2023-09-24 NOTE — Therapy (Signed)
 OUTPATIENT OCCUPATIONAL THERAPY NEURO EVALUATION  Patient Name: Kim Hayes MRN: [id number] DOB:03/13/1945, 78 y.o., female Today's Date: 09/25/2023   REFERRING PROVIDER: Sadie Manna, MD  END OF SESSION:  OT End of Session - 09/25/23 0139     Visit Number 1    Number of Visits 24    Date for OT Re-Evaluation 12/17/23    OT Start Time 1620    OT Stop Time 1700    OT Time Calculation (min) 40 min    Activity Tolerance Patient tolerated treatment well    Behavior During Therapy [facility] for tasks assessed/performed          Past Medical History:  Diagnosis Date   Hypertension    No past surgical history on file. There are no active problems to display for this patient.   ONSET DATE: 04/2021  REFERRING DIAG: Inclusion Body Myositis  THERAPY DIAG:  Muscle weakness (generalized)  Rationale for Evaluation and Treatment: Rehabilitation  SUBJECTIVE:   SUBJECTIVE STATEMENT:  Pt. Arrived to OT following PT Pt accompanied by: self  PERTINENT HISTORY: Pt. is an 40 y. o. female who was experiencing muscular weakness distally in the BUEs which started in 04/2021. Pt. was diagnosed with Inclusion Body Myositis  approximately one year ago at [facility]. PMHx includes: HTN, Borderline DM.  PRECAUTIONS: None  WEIGHT BEARING RESTRICTIONS: No  PAIN:  Are you having pain? No  FALLS: Has patient fallen in last 6 months? No  LIVING ENVIRONMENT: Lives with: lives with their spouse with medical issues as well. Lives in: Condo Stairs: One small step  Has following equipment at home: Single point cane and Walker - 2 wheeled  PLOF:  Independent  PATIENT GOALS: To regain strength in bilateral hands.  OBJECTIVE:  Note: Objective measures were completed at Evaluation unless otherwise noted.  HAND DOMINANCE: Right  ADLs:   Eating: Independent, increased time required, some difficulty cutting food, has difficulty hold a cup-uses both hands to complete, Grooming:  Difficulty opening [place], Independent  UB Dressing: Independent LB Dressing: independent, difficulty grasping small zippers. Toileting: Independent  Bathing: Independent Tub Shower transfers:  Independent    IADLs: Shopping:  Has difficulty removing items from her wallet. Light housekeeping: Independent, difficulty holding a laundry basket, applying the fitted sheet around the corner of the mattress. Meal Prep: independent, difficulty using kitchen cooking utensils transporting heavier items Community mobility: Driving Medication management: Independent  Financial management: No change Handwriting: 75% legible Leisure/Hobbies: Collected, and sold pottery, Physiological scientist Work History: Banking-retired  MOBILITY STATUS: Independent    ACTIVITY TOLERANCE: Activity tolerance: fatigues after 20-30 min. of activity  FUNCTIONAL OUTCOME MEASURES: MAM-20 score: 71/80  UPPER EXTREMITY ROM:    Active ROM Right  WFL Left WFL  Shoulder flexion    Shoulder abduction    Shoulder adduction    Shoulder extension    Shoulder internal rotation    Shoulder external rotation    Elbow flexion    Elbow extension    Wrist flexion    Wrist extension    Wrist ulnar deviation    Wrist radial deviation    Wrist pronation    Wrist supination    (Blank rows = not tested)   Eval: Fisting: R: Able to achieve 75% , L: 50%  Right: Digit hyperextension of the PIPs, and DIPs  Left: Digit hyperextension in the  3rd, and 4th digits  UPPER EXTREMITY MMT:     MMT Right eval Left eval  Shoulder flexion 5/5 5/5  Shoulder abduction 4-/5 5/5  Shoulder adduction    Shoulder extension    Shoulder internal rotation    Shoulder external rotation    Middle trapezius    Lower trapezius    Elbow flexion 5/5 5/5  Elbow extension 5/5 5/5  Wrist flexion 5/5 5/5  Wrist extension 5/5 5/5  Wrist ulnar deviation    Wrist radial deviation    Wrist pronation 5/5 5/5  Wrist supination 5/5 5/5   (Blank rows = not tested)  HAND FUNCTION: Grip strength: Right: 18 lbs; Left: 18 lbs, Lateral pinch: Right: 6 lbs, Left: 5 lbs, and 3 point pinch: Right: 2 lbs, Left: 3 lbs  COORDINATION: 9 Hole Peg test: Right: 37 sec; Left: 37 sec  SENSATION: Light touch: WFL Stereognosis: WFL  EDEMA: N/A  MUSCLE TONE: Hypotonicity  COGNITION: Overall cognitive status: Within functional limits for tasks assessed  VISION: No change form baseline; wears glasses  PERCEPTION: WFL  PRAXIS: WFL                                                                                                                            TREATMENT DATE: 09/24/23    OT initial evaluation was completed, and Pt. education was provided as indicated below.    PATIENT EDUCATION: Education details: OT services, POC, goals and ADL/IADL functional Status.  Person educated: Patient Education method: Explanation, Demonstration, Tactile cues, and Verbal cues Education comprehension: verbalized understanding, returned demonstration, verbal cues required, tactile cues required, and needs further education  HOME EXERCISE PROGRAM:  Continue to assess ongoing need for HEPs, and provide/upgrade as indicated.    GOALS: Goals reviewed with patient? Yes  SHORT TERM GOALS: Target date: 11/06/2023     Pt. Will be independent with HEPs for hand strength, and FMC skills Baseline:Eval: No current HEP Goal status: INITIAL    LONG TERM GOALS: Target date: 12/18/2023    Pt. Will improve bilateral grip strength by 5# to be able to securely hold a weighted items. Baseline: Eval: R: 18#, L: 18# Pt. Is unable to hold weighted items, and casserole dishes. Goal status: INITIAL  2.  Pt. Will improve bilateral pinch strength to be able to hold and use a peeler Baseline: Lateral pinch: Right: 6 lbs, Left: 5 lbs, and 3 point pinch: Right: 2 lbs, Left: 3 lbs Goal status: INITIAL  3.  Pt. Will improve Bilateral hand coordination  skills to bee able to pick up coins/small items off the surface of the table. Baseline: Eval: 9 Hole Peg test: Right: 37 sec; Left: 37 sec Goal status: INITIAL  4.  Pt. Will demonstrate 3 energy conservation/work simplification strategies for home management tasks. Baseline: Eval: Education to be provided. Goal status: INITIAL  5.  Pt. Will write a 3 sentence paragraph with 100% legibility in preparation for writing written correspondence Baseline: Eval: 75% legibility for name only. Goal status: INITIAL  ASSESSMENT:  CLINICAL IMPRESSION: Patient is an 78 y.o. female who was  seen today for occupational therapy evaluation for Inclusion Body Myositis.  Pt. presents with weakness, and decreased strength in right shoulder abduction, bilateral grip strength, bilateral pinch strength, and impaired bilateral [facility] skills.  These limitations hinder the the pt. From being able to securely hold items in her hands, efficiently lift weighted items, use a peeler, manipulate small items, pick up cons from a tabletop surface, and write several sentences legibly, and efficiently. MAM-20 score: 71-80. Pt. Will benefit from OT services to work on improving BUE strength, and coordination skills in order to improve and maximize independence with ADLs, and IADLs.  PERFORMANCE DEFICITS: in functional skills including ADLs, IADLs, coordination, dexterity, proprioception, ROM, strength, pain, Fine motor control, Gross motor control, decreased knowledge of precautions, and UE functional use, cognitive skills including , and psychosocial skills including coping strategies, environmental adaptation, habits, and routines and behaviors.   IMPAIRMENTS: are limiting patient from ADLs, IADLs, and leisure.   CO-MORBIDITIES: may have co-morbidities  that affects occupational performance. Patient will benefit from skilled OT to address above impairments and improve overall function.  MODIFICATION OR ASSISTANCE TO COMPLETE  EVALUATION: Min-Moderate modification of tasks or assist with assess necessary to complete an evaluation.  OT OCCUPATIONAL PROFILE AND HISTORY: Detailed assessment: Review of records and additional review of physical, cognitive, psychosocial history related to current functional performance.  CLINICAL DECISION MAKING: Moderate - several treatment options, min-mod task modification necessary  REHAB POTENTIAL: Good  EVALUATION COMPLEXITY: Moderate    PLAN:  OT FREQUENCY: 2x/week  OT DURATION: 12 weeks  PLANNED INTERVENTIONS: 97168 OT Re-evaluation, 97535 self care/ADL training, 02889 therapeutic exercise, 97530 therapeutic activity, 97112 neuromuscular re-education, 97140 manual therapy, 97018 paraffin, 02989 moist heat, 97034 contrast bath, and 97033 iontophoresis, Orthotic fitting  RECOMMENDED OTHER SERVICES: PT  CONSULTED AND AGREED WITH PLAN OF CARE: Patient  PLAN FOR NEXT SESSION: Treatment  Richardson Otter, MS, OTR/L  09/25/2023, 1:40 AM

## 2023-09-28 ENCOUNTER — Ambulatory Visit

## 2023-09-28 DIAGNOSIS — R29898 Other symptoms and signs involving the musculoskeletal system: Secondary | ICD-10-CM

## 2023-09-28 DIAGNOSIS — R278 Other lack of coordination: Secondary | ICD-10-CM

## 2023-09-28 DIAGNOSIS — R2689 Other abnormalities of gait and mobility: Secondary | ICD-10-CM

## 2023-09-28 DIAGNOSIS — M6281 Muscle weakness (generalized): Secondary | ICD-10-CM

## 2023-09-28 DIAGNOSIS — R262 Difficulty in walking, not elsewhere classified: Secondary | ICD-10-CM

## 2023-09-28 NOTE — Therapy (Signed)
 OUTPATIENT PHYSICAL THERAPY TREATMENT   Patient Name: Kim Hayes MRN: [id number] DOB:1945/09/18, 78 y.o., female Today's Date: 09/28/2023   PCP: Sadie Manna, MD  REFERRING PROVIDER: Sadie Manna, MD   END OF SESSION:    PT End of Session - 09/28/23 1321     Visit Number 5    Number of Visits 25    Date for PT Re-Evaluation 12/08/23    Authorization Type BCBS Medicare    Progress Note Due on Visit 10    PT Start Time 1315    PT Stop Time 1355    PT Time Calculation (min) 40 min    Equipment Utilized During Treatment Gait belt    Activity Tolerance Patient tolerated treatment well;No increased pain;Patient limited by fatigue    Behavior During Therapy [facility] for tasks assessed/performed            Past Medical History:  Diagnosis Date   Hypertension    No past surgical history on file. There are no active problems to display for this patient.  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 She is generally doing well overall.   PERTINENT HISTORY: Pt reports that she has been having some L foot drop for approximately one year.  Pt just started wearing the brace ~3-4 months ago as it was advised by an MD with [facility] in [place]. Pt is seeking therapy for the foot drop to see if she can improve it with therapy. Pt reports that she first started having difficulty with grasping items with  the UE's and then eventually progressed to having foot drop in the L side.  Pt reports the MD did educated her slightly on the body myositis diagnosis and that it would eventually cause her muscles to give out.Pt has hx of HTN and new diagnosis of body myositis.  PAIN:  Are you having pain? No  PRECAUTIONS: None  RED FLAGS: None   WEIGHT BEARING RESTRICTIONS: No  FALLS: Has patient fallen in last 6 months? Yes. Number of falls 1.  Pt reports  she was unable to pick the L foot up in December and she broke some of her ribs during the fall.  LIVING ENVIRONMENT: Lives with: lives with their spouse Lives in: House/apartment Stairs: Pt reports having one step from the porch into the living room. Has following equipment at home: Single point cane and Walker - 2 wheeled  PLOF: Independent  PATIENT GOALS: Strengthen muscles in order to improve walking ability.  OBJECTIVE:  Note: Objective measures were completed at Evaluation unless otherwise noted.                                                                                                                             TREATMENT DATE: 09/28/23 -pt doffs AFO  - short sitting LLE ankle DF 1x15 (trace movement only)  - short sitting LLE ankle IV/EV ankle slides x15 (partial movement)  - short sitting YTB resisted wide marching x10 bilat  - STS hands-free 2x10 - seated Rt ankle DF 1x15 @ 5lb  - standing Rt ankle heel raises x20  - seated Rt ankle DF 1x15 @ 5lb - standing Rt single leg heel raise x20 (left foot on 2nd step)  - airex foam with step forward, then horizontal head turns x20, then eyes closed x60sec  - foam forward step up, forward step down, the turnaround and repeat then 4 firm step and 3 foam step  - foam forward step up, forward step down, the turnaround and repeat then 6 firm step and 8 foam step  - backward walking overground, minGaurd assist, then additional of foam yellow TB  91ft without, then 76ft with    PATIENT EDUCATION: Education details: Pt educated on role of PT and services provided during current POC, along with prognosis and information about the clinic. Person educated: Patient Education method: Explanation, Demonstration, and Tactile cues Education comprehension: verbalized understanding  HOME EXERCISE PROGRAM: Access Code: [email or web address] URL: [email or web address] Date: 09/24/2023 Prepared by: Chiquita Silvan  Exercises - Seated Ankle Alphabet  - 1 x daily - 7 x weekly - 2 sets - 10 reps - 5 hold - Single Leg Heel Raise  - 1 x daily - 7 x weekly - 2 sets - 10 reps - 5 hold - Standing Ankle Dorsiflexion with Table Support  - 1 x daily - 7 x weekly - 2 sets - 10 reps - 5 hold - Single Leg Stance  - 1 x daily - 7 x weekly - 2 sets - 2 reps - 30 hold - Seated Ankle Plantar Flexion with Resistance Loop  - 1 x daily - 7 x weekly - 2 sets - 10 reps - 5 hold - Seated Ankle Inversion with Resistance  - 1 x daily - 7 x weekly - 2 sets - 10 reps - 5 hold - Seated Toe Raise  - 1 x daily - 7 x weekly - 3 sets - 10 reps - Supine Ankle Eversion AROM  - 1 x daily - 7  x weekly - 3 sets - 10 reps  GOALS: Goals reviewed with patient? Yes  SHORT TERM GOALS: Target date: 10/13/2023 Pt will be independent with HEP in order to demonstrate increased ability to perform tasks related to occupation/hobbies. Baseline: HEP generated at evaluation Goal status: INITIAL  LONG TERM GOALS: Target date: 12/08/2023 1.  Patient (> 21 years old) will complete five times sit to stand test in < 12 seconds indicating an increased LE strength and improved balance. Baseline: 13.2 sec Goal status: INITIAL  2.  Patient will increase FADI score to equal to or greater than  94%   to demonstrate statistically significant improvement in mobility and quality of life.  Baseline: 88% Goal status: INITIAL   3.  Patient will increase FGA score by > 4 points to demonstrate decreased fall risk during functional  activities. Baseline: 12/30 Goal status: INITIAL   4.  Patient will reduce timed up and go to <10 seconds to reduce fall risk and demonstrate improved transfer/gait ability. Baseline: 10.18 Goal status: INITIAL  5.  Patient will increase 10 meter walk test to >1.70m/s as to improve gait speed for better community ambulation and to reduce fall risk. Baseline: 10.6 sec; 0.94 m/s Goal status: INITIAL  6.  Patient will increase six minute walk test distance to 392 m for progression to meet age matched norm and improve gait ability Baseline: 1243 feet (378.8 meters, Avg speed 1.05 m/s) Goal status: INITIAL   ASSESSMENT:  CLINICAL IMPRESSION:   Began to transition from open chain strengthening to functional mobility retraining with AFO in place. Pt does generally very well. Pt will continue to benefit from continued strengthening of the LE along with more dynamic stability/balance related exercises in the subsequent sessions.     OBJECTIVE IMPAIRMENTS: Abnormal gait, decreased activity tolerance, decreased balance, decreased endurance, decreased mobility, difficulty walking, decreased ROM, and decreased strength.   ACTIVITY LIMITATIONS: carrying, lifting, squatting, and bathing  PARTICIPATION LIMITATIONS: meal prep, cleaning, laundry, personal finances, shopping, community activity, and yard work  PERSONAL FACTORS: Time since onset of injury/illness/exacerbation and 1-2 comorbidities: HTN, DM2, Inclusion body myositis are also affecting patient's functional outcome.   REHAB POTENTIAL: Good  CLINICAL DECISION MAKING: Stable/uncomplicated  EVALUATION COMPLEXITY: Low  PLAN:  PT FREQUENCY: 2x/week  PT DURATION: 12 weeks  PLANNED INTERVENTIONS: 97750- Physical Performance Testing, 97110-Therapeutic exercises, 97530- Therapeutic activity, 97112- Neuromuscular re-education, 97535- Self Care, 02859- Manual therapy, [phone]- Gait training, [phone]- Electrical stimulation (manual), [phone] (1-2  muscles), 20561 (3+ muscles)- Dry Needling, Balance training, and Stair training  PLAN FOR NEXT SESSION:   Dynamic balance interventions stepping over object, stair navigation, gait with eyes closed, gait with narrow BOS, gait with head turns, gait with pivot turn LE strengthening   1:24 PM, 09/28/23 Peggye JAYSON Linear, PT, DPT Physical Therapist - [facility]  Outpatient Physical Therapy- Main Campus [phone]

## 2023-09-29 NOTE — Therapy (Signed)
 OUTPATIENT OCCUPATIONAL THERAPY NEURO TREATMENT NOTE  Patient Name: Kim Hayes MRN: [id number] DOB:02/24/1945, 78 y.o., female Today's Date: 09/29/2023  REFERRING PROVIDER: Sadie Manna, MD  END OF SESSION:  OT End of Session - 09/29/23 2052     Visit Number 2    Number of Visits 24    Date for OT Re-Evaluation 12/17/23    OT Start Time 1400    OT Stop Time 1445    OT Time Calculation (min) 45 min    Activity Tolerance Patient tolerated treatment well    Behavior During Therapy [facility] for tasks assessed/performed         Past Medical History:  Diagnosis Date   Hypertension    No past surgical history on file. There are no active problems to display for this patient.  ONSET DATE: 04/2021  REFERRING DIAG: Inclusion Body Myositis  THERAPY DIAG:  Muscle weakness (generalized)  Other lack of coordination  Rationale for Evaluation and Treatment: Rehabilitation  SUBJECTIVE:  SUBJECTIVE STATEMENT: Pt reports doing well today.  Pt accompanied by: self  PERTINENT HISTORY: Pt. is an 64 y. o. female who was experiencing muscular weakness distally in the BUEs which started in 04/2021. Pt. was diagnosed with Inclusion Body Myositis  approximately one year ago at [facility]. PMHx includes: HTN, Borderline DM.  PRECAUTIONS: None  WEIGHT BEARING RESTRICTIONS: No  PAIN:  Are you having pain? No  FALLS: Has patient fallen in last 6 months? No  LIVING ENVIRONMENT: Lives with: lives with their spouse with medical issues as well. Lives in: Condo Stairs: One small step  Has following equipment at home: Single point cane and Walker - 2 wheeled  PLOF:  Independent  PATIENT GOALS: To regain strength in bilateral hands.  OBJECTIVE:  Note: Objective measures were completed at Evaluation unless otherwise noted.  HAND DOMINANCE: Right  ADLs: Eating: Independent, increased time required, some difficulty cutting food, has difficulty hold a cup-uses both hands to  complete, Grooming: Difficulty opening [place], Independent  UB Dressing: Independent LB Dressing: independent, difficulty grasping small zippers. Toileting: Independent  Bathing: Independent Tub Shower transfers:  Independent   IADLs: Shopping:  Has difficulty removing items from her wallet. Light housekeeping: Independent, difficulty holding a laundry basket, applying the fitted sheet around the corner of the mattress. Meal Prep: independent, difficulty using kitchen cooking utensils transporting heavier items Community mobility: Driving Medication management: Independent  Financial management: No change Handwriting: 75% legible Leisure/Hobbies: Collected, and sold pottery, Physiological scientist Work History: Banking-retired  MOBILITY STATUS: Independent  ACTIVITY TOLERANCE: Activity tolerance: fatigues after 20-30 min. of activity  FUNCTIONAL OUTCOME MEASURES: MAM-20 score: 71/80  UPPER EXTREMITY ROM:    Active ROM Right  WFL Left WFL  Shoulder flexion    Shoulder abduction    Shoulder adduction    Shoulder extension    Shoulder internal rotation    Shoulder external rotation    Elbow flexion    Elbow extension    Wrist flexion    Wrist extension    Wrist ulnar deviation    Wrist radial deviation    Wrist pronation    Wrist supination    (Blank rows = not tested)   Eval: Fisting: R: Able to achieve 75% , L: 50%  Right: Digit hyperextension of the PIPs, and DIPs  Left: Digit hyperextension in the  3rd, and 4th digits  UPPER EXTREMITY MMT:     MMT Right eval Left eval  Shoulder flexion 5/5 5/5  Shoulder abduction 4-/5  5/5  Shoulder adduction    Shoulder extension    Shoulder internal rotation    Shoulder external rotation    Middle trapezius    Lower trapezius    Elbow flexion 5/5 5/5  Elbow extension 5/5 5/5  Wrist flexion 5/5 5/5  Wrist extension 5/5 5/5  Wrist ulnar deviation    Wrist radial deviation    Wrist pronation 5/5 5/5  Wrist  supination 5/5 5/5  (Blank rows = not tested)  HAND FUNCTION: Grip strength: Right: 18 lbs; Left: 18 lbs, Lateral pinch: Right: 6 lbs, Left: 5 lbs, and 3 point pinch: Right: 2 lbs, Left: 3 lbs  COORDINATION: 9 Hole Peg test: Right: 37 sec; Left: 37 sec  SENSATION: Light touch: WFL Stereognosis: WFL  EDEMA: N/A  MUSCLE TONE: Hypotonicity  COGNITION: Overall cognitive status: Within functional limits for tasks assessed  VISION: No change form baseline; wears glasses  PERCEPTION: WFL  PRAXIS: WFL                                                                                                                            TREATMENT DATE: 09/28/23  Therapeutic Exercise: -Passive MP extension all digits in each hand in prep for exercises noted below. -Passive PIP and DIP flexion all digits in each hand in prep for exercises noted below. -Issued yellow theraputty and instructed pt in strengthening exercises for bilat hands, including gross grasping, lateral/2 point/3 point pinching, digit abd/add, and digging coins out of putty.  Able to return demo with mod vc for technique to improve quality of movement.  Encouraged completion 1-2x per day.    Self Care: -Issued written handout for theraputty/HEP and reviewed with pt.   -Instruction given on self passive stretching techniques, benefits of daily stretching to reduce stiffness d/t limitations in AROM with MP ext and PIP/DIP flexion in all digits.  -Reviewed recent neurology note with pt; per MD note, plan for 2nd EMG/NCS; pt has not had this scheduled but was encouraged to call by end of week if she has not heard from MD office.   -Instruction in use of button hook for management of buttons and zippers on shirts/pants  PATIENT EDUCATION: Education details: HEP Person educated: Patient Education method: Programmer, multimedia, Demonstration, Verbal cues, and Handouts Education comprehension: verbalized understanding, returned demonstration,  verbal cues required, tactile cues required, and needs further education  HOME EXERCISE PROGRAM:  Yellow theraputty, self passive ROM bilat hands  GOALS: Goals reviewed with patient? Yes  SHORT TERM GOALS: Target date: 11/06/2023     Pt. Will be independent with HEPs for hand strength, and FMC skills Baseline:Eval: No current HEP Goal status: INITIAL  LONG TERM GOALS: Target date: 12/18/2023    Pt. Will improve bilateral grip strength by 5# to be able to securely hold a weighted items. Baseline: Eval: R: 18#, L: 18# Pt. Is unable to hold weighted items, and casserole dishes. Goal status: INITIAL  2.  Pt. Will improve  bilateral pinch strength to be able to hold and use a peeler Baseline: Lateral pinch: Right: 6 lbs, Left: 5 lbs, and 3 point pinch: Right: 2 lbs, Left: 3 lbs Goal status: INITIAL  3.  Pt. Will improve Bilateral hand coordination skills to bee able to pick up coins/small items off the surface of the table. Baseline: Eval: 9 Hole Peg test: Right: 37 sec; Left: 37 sec Goal status: INITIAL  4.  Pt. Will demonstrate 3 energy conservation/work simplification strategies for home management tasks. Baseline: Eval: Education to be provided. Goal status: INITIAL  5.  Pt. Will write a 3 sentence paragraph with 100% legibility in preparation for writing written correspondence Baseline: Eval: 75% legibility for name only. Goal status: INITIAL  ASSESSMENT: CLINICAL IMPRESSION: Good tolerance to passive stretching to bilat hands and initiation of yellow theraputty exercises.  Pt with minimal to no active PIP/DIP flexion in all digits, requiring use of lumbrical grasp when attempting to squeeze or pinch putty in either hand.  Neurology is planning 2nd EMG/NCS to compare with 1st study at the beginning of this year; 2nd study is not yet scheduled.  OT initiated self passive PIP/DIP stretching around putty to maintain joint flexibility, and to perform exercises to tolerance and  ability given vast intrinsic and extrinsic weakness in bilat hands.  Further training needed to work towards indep with visual handout.  OT issued button hook and encouraged pt to practice with a variety of clothing at home.  Pt encouraged to keep this if she finds it to help with dressing efficiency, or may return if not.  Pt will benefit from OT services to work on improving BUE strength, and coordination skills in order to improve and maximize independence with ADLs, and IADLs.  PERFORMANCE DEFICITS: in functional skills including ADLs, IADLs, coordination, dexterity, proprioception, ROM, strength, pain, Fine motor control, Gross motor control, decreased knowledge of precautions, and UE functional use, cognitive skills including , and psychosocial skills including coping strategies, environmental adaptation, habits, and routines and behaviors.   IMPAIRMENTS: are limiting patient from ADLs, IADLs, and leisure.   CO-MORBIDITIES: may have co-morbidities  that affects occupational performance. Patient will benefit from skilled OT to address above impairments and improve overall function.  MODIFICATION OR ASSISTANCE TO COMPLETE EVALUATION: Min-Moderate modification of tasks or assist with assess necessary to complete an evaluation.  OT OCCUPATIONAL PROFILE AND HISTORY: Detailed assessment: Review of records and additional review of physical, cognitive, psychosocial history related to current functional performance.  CLINICAL DECISION MAKING: Moderate - several treatment options, min-mod task modification necessary  REHAB POTENTIAL: Good  EVALUATION COMPLEXITY: Moderate    PLAN:  OT FREQUENCY: 2x/week  OT DURATION: 12 weeks  PLANNED INTERVENTIONS: 97168 OT Re-evaluation, 97535 self care/ADL training, 02889 therapeutic exercise, 97530 therapeutic activity, 97112 neuromuscular re-education, 97140 manual therapy, 97018 paraffin, 02989 moist heat, 97034 contrast bath, and 97033 iontophoresis,  Orthotic fitting  RECOMMENDED OTHER SERVICES: PT  CONSULTED AND AGREED WITH PLAN OF CARE: Patient  PLAN FOR NEXT SESSION: Treatment  Inocente Blazing, MS, OTR/L    09/29/2023, 8:54 PM

## 2023-09-30 ENCOUNTER — Ambulatory Visit

## 2023-09-30 ENCOUNTER — Ambulatory Visit: Admitting: Occupational Therapy

## 2023-09-30 DIAGNOSIS — R278 Other lack of coordination: Secondary | ICD-10-CM

## 2023-09-30 DIAGNOSIS — M6281 Muscle weakness (generalized): Secondary | ICD-10-CM

## 2023-09-30 DIAGNOSIS — R29898 Other symptoms and signs involving the musculoskeletal system: Secondary | ICD-10-CM

## 2023-09-30 DIAGNOSIS — R2689 Other abnormalities of gait and mobility: Secondary | ICD-10-CM

## 2023-09-30 DIAGNOSIS — R262 Difficulty in walking, not elsewhere classified: Secondary | ICD-10-CM

## 2023-09-30 NOTE — Therapy (Unsigned)
 OUTPATIENT PHYSICAL THERAPY TREATMENT   Patient Name: Kim Hayes MRN: [id number] DOB:01-27-1945, 78 y.o., female Today's Date: 10/01/2023   PCP: Kim Manna, MD  REFERRING PROVIDER: Sadie Manna, MD   END OF SESSION:    PT End of Session - 09/30/23 0931     Visit Number 6    Number of Visits 25    Date for PT Re-Evaluation 12/08/23    Authorization Type BCBS Medicare    Progress Note Due on Visit 10    PT Start Time 0847    PT Stop Time 0932    PT Time Calculation (min) 45 min    Equipment Utilized During Treatment Gait belt    Activity Tolerance Patient tolerated treatment well;No increased pain;Patient limited by fatigue    Behavior During Therapy [facility] for tasks assessed/performed             Past Medical History:  Diagnosis Date   Hypertension    History reviewed. No pertinent surgical history. There are no active problems to display for this patient.  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 Kim Hayes   PERTINENT HISTORY: Pt reports that she has been having some L foot drop for approximately one year.  Pt just started wearing the brace ~3-4 months ago as it was advised by an MD with [facility] in [place]. Pt is seeking therapy for the foot drop to see if she can improve it with therapy. Pt reports that she first  started having difficulty with grasping items with the UE's and then eventually progressed to having foot drop in the L side.  Pt reports the MD did educated her slightly on the body myositis diagnosis and that it would eventually cause her muscles to give out.Pt has hx of HTN and new diagnosis of body myositis.  PAIN:  Are you having pain? No  PRECAUTIONS: None  RED FLAGS: None   WEIGHT BEARING RESTRICTIONS: No  FALLS: Has patient fallen in last 6 months?  Yes. Number of falls 1.  Pt reports she was unable to pick the L foot up in December and she broke some of her ribs during the fall.  LIVING ENVIRONMENT: Lives with: lives with their spouse Lives in: House/apartment Stairs: Pt reports having one step from the porch into the living room. Has following equipment at home: Single point cane and Walker - 2 wheeled  PLOF: Independent  PATIENT GOALS: Strengthen muscles in order to improve walking ability.  OBJECTIVE:  Note: Objective measures were completed at Evaluation unless otherwise noted.                                                                                                                             TREATMENT DATE: 10/01/23 -pt doffs AFO   TA:  [employer]: - Thermometer at low setting x 5 min- AAROM - Powerlifter- low setting x 5 min  - STS hands-free  with airex pad under RLE2x10  TE:   - short sitting LLE ankle DF 1x15  - short sitting LLE ankle IV/EV 1x 15     NMR:  Tandem standing x 30 sec x 2 each LE - Dynamic step tap (while standing on purple pad) no UE support x 20 alt LE  -- airex foam with step forward, then horizontal head turns x20  PATIENT EDUCATION: Education details: Pt educated on role of PT and services provided during current POC, along with prognosis and information about the clinic. Person educated: Patient Education method: Explanation, Demonstration, and Tactile cues Education comprehension: verbalized understanding  HOME  EXERCISE PROGRAM: Access Code: [email or web address] URL: [email or web address] Date: 09/24/2023 Prepared by: Kim Hayes  Exercises - Seated Ankle Alphabet  - 1 x daily - 7 x weekly - 2 sets - 10 reps - 5 hold - Single Leg Heel Raise  - 1 x daily - 7 x weekly - 2 sets - 10 reps - 5 hold - Standing Ankle Dorsiflexion with Table Support  - 1 x daily - 7 x weekly - 2 sets - 10 reps - 5 hold - Single Leg Stance  - 1 x daily - 7 x weekly - 2 sets - 2 reps - 30 hold - Seated Ankle Plantar Flexion with Resistance Loop  - 1 x daily - 7 x weekly - 2 sets - 10 reps - 5 hold - Seated Ankle Inversion with Resistance  - 1 x daily - 7 x weekly - 2 sets - 10 reps - 5 hold - Seated Toe Raise  - 1 x daily - 7 x weekly - 3 sets - 10 reps - Supine Ankle Eversion AROM  - 1 x daily - 7 x weekly - 3 sets - 10 reps  GOALS: Goals reviewed with patient? Yes  SHORT TERM GOALS: Target date: 10/13/2023 Pt will be independent with HEP in order to demonstrate increased ability to perform tasks related to occupation/hobbies. Baseline: HEP generated at evaluation Goal status: INITIAL  LONG TERM  GOALS: Target date: 12/08/2023 1.  Patient (> 84 years old) will complete five times sit to stand test in < 12 seconds indicating an increased LE strength and improved balance. Baseline: 13.2 sec Goal status: INITIAL  2.  Patient will increase FADI score to equal to or greater than  94%   to demonstrate statistically significant improvement in mobility and quality of life.  Baseline: 88% Goal status: INITIAL   3.  Patient will increase FGA score by > 4 points to demonstrate decreased fall risk during functional activities. Baseline: 12/30 Goal status: INITIAL   4.  Patient will reduce timed up and go to <10 seconds to reduce fall risk and demonstrate improved transfer/gait ability. Baseline: 10.18 Goal status: INITIAL  5.  Patient will increase 10 meter walk test to >1.73m/s as to improve gait speed for better  community ambulation and to reduce fall risk. Baseline: 10.6 sec; 0.94 m/s Goal status: INITIAL  6.  Patient will increase six minute walk test distance to 392 m for progression to meet age matched norm and improve gait ability Baseline: 1243 feet (378.8 meters, Avg speed 1.05 m/s) Goal status: INITIAL   ASSESSMENT:  CLINICAL IMPRESSION:   Treatment continued with L ankle ROM/strengthening. Patient was able to successfully use the [facility] equipment and performed well with 2 activities. She did endorse some ankle fatigue but overall performed well. Her ankle was later challenged with some standing stability activities and patient was unsteady at times and will benefit from further balance training to assist with left ankle stabilization. Pt will continue to benefit from continued strengthening of the LE along with more dynamic stability/balance related exercises in the subsequent sessions.     OBJECTIVE IMPAIRMENTS: Abnormal gait, decreased activity tolerance, decreased balance, decreased endurance, decreased mobility, difficulty walking, decreased ROM, and decreased strength.   ACTIVITY LIMITATIONS: carrying, lifting, squatting, and bathing  PARTICIPATION LIMITATIONS: meal prep, cleaning, laundry, personal finances, shopping, community activity, and yard work  PERSONAL FACTORS: Time since onset of injury/illness/exacerbation and 1-2 comorbidities: HTN, DM2, Inclusion body myositis are also affecting patient's functional outcome.   REHAB POTENTIAL: Good  CLINICAL DECISION MAKING: Stable/uncomplicated  EVALUATION COMPLEXITY: Low  PLAN:  PT FREQUENCY: 2x/week  PT DURATION: 12 weeks  PLANNED INTERVENTIONS: 97750- Physical Performance Testing, 97110-Therapeutic exercises, 97530- Therapeutic activity, 97112- Neuromuscular re-education, 97535- Self Care, 02859- Manual therapy, [phone]- Gait training, [phone]- Electrical stimulation (manual), [phone] (1-2 muscles), 20561 (3+ muscles)- Dry  Needling, Balance training, and Stair training  PLAN FOR NEXT SESSION:   Dynamic balance interventions stepping over object, stair navigation, gait with eyes closed, gait with narrow BOS, gait with head turns, gait with pivot turn LE strengthening   11:03 AM, 10/01/23 Chyrl London, PT Physical Therapist - [facility]  Outpatient Physical Therapy- Main Campus [phone]

## 2023-09-30 NOTE — Therapy (Signed)
 OUTPATIENT OCCUPATIONAL THERAPY NEURO TREATMENT NOTE  Patient Name: Kim Hayes MRN: [id number] DOB:Sep 22, 1945, 78 y.o., female Today's Date: 09/30/2023  REFERRING PROVIDER: Sadie Manna, MD  END OF SESSION:  OT End of Session - 09/30/23 1201     Visit Number 3    Number of Visits 24    Date for OT Re-Evaluation 12/17/23    OT Start Time 0930    OT Stop Time 1015    OT Time Calculation (min) 45 min    Activity Tolerance Patient tolerated treatment well    Behavior During Therapy [facility] for tasks assessed/performed         Past Medical History:  Diagnosis Date   Hypertension    No past surgical history on file. There are no active problems to display for this patient.  ONSET DATE: 04/2021  REFERRING DIAG: Inclusion Body Myositis  THERAPY DIAG:  Muscle weakness (generalized)  Other lack of coordination  Rationale for Evaluation and Treatment: Rehabilitation  SUBJECTIVE:  SUBJECTIVE STATEMENT: Pt reports doing well today.  Pt accompanied by: self  PERTINENT HISTORY: Pt. is an 75 y. o. female who was experiencing muscular weakness distally in the BUEs which started in 04/2021. Pt. was diagnosed with Inclusion Body Myositis  approximately one year ago at [facility]. PMHx includes: HTN, Borderline DM.  PRECAUTIONS: None  WEIGHT BEARING RESTRICTIONS: No  PAIN:  Are you having pain? No  FALLS: Has patient fallen in last 6 months? No  LIVING ENVIRONMENT: Lives with: lives with their spouse with medical issues as well. Lives in: Condo Stairs: One small step  Has following equipment at home: Single point cane and Walker - 2 wheeled  PLOF:  Independent  PATIENT GOALS: To regain strength in bilateral hands.  OBJECTIVE:  Note: Objective measures were completed at Evaluation unless otherwise noted.  HAND DOMINANCE: Right  ADLs: Eating: Independent, increased time required, some difficulty cutting food, has difficulty hold a cup-uses both hands to  complete, Grooming: Difficulty opening [place], Independent  UB Dressing: Independent LB Dressing: independent, difficulty grasping small zippers. Toileting: Independent  Bathing: Independent Tub Shower transfers:  Independent   IADLs: Shopping:  Has difficulty removing items from her wallet. Light housekeeping: Independent, difficulty holding a laundry basket, applying the fitted sheet around the corner of the mattress. Meal Prep: independent, difficulty using kitchen cooking utensils transporting heavier items Community mobility: Driving Medication management: Independent  Financial management: No change Handwriting: 75% legible Leisure/Hobbies: Collected, and sold pottery, Physiological scientist Work History: Banking-retired  MOBILITY STATUS: Independent  ACTIVITY TOLERANCE: Activity tolerance: fatigues after 20-30 min. of activity  FUNCTIONAL OUTCOME MEASURES: MAM-20 score: 71/80  UPPER EXTREMITY ROM:    Active ROM Right  WFL Left WFL  Shoulder flexion    Shoulder abduction    Shoulder adduction    Shoulder extension    Shoulder internal rotation    Shoulder external rotation    Elbow flexion    Elbow extension    Wrist flexion    Wrist extension    Wrist ulnar deviation    Wrist radial deviation    Wrist pronation    Wrist supination    (Blank rows = not tested)   Eval: Fisting: R: Able to achieve 75% , L: 50%  Right: Digit hyperextension of the PIPs, and DIPs  Left: Digit hyperextension in the  3rd, and 4th digits  UPPER EXTREMITY MMT:     MMT Right eval Left eval  Shoulder flexion 5/5 5/5  Shoulder abduction 4-/5  5/5  Shoulder adduction    Shoulder extension    Shoulder internal rotation    Shoulder external rotation    Middle trapezius    Lower trapezius    Elbow flexion 5/5 5/5  Elbow extension 5/5 5/5  Wrist flexion 5/5 5/5  Wrist extension 5/5 5/5  Wrist ulnar deviation    Wrist radial deviation    Wrist pronation 5/5 5/5  Wrist  supination 5/5 5/5  (Blank rows = not tested)  HAND FUNCTION: Grip strength: Right: 18 lbs; Left: 18 lbs, Lateral pinch: Right: 6 lbs, Left: 5 lbs, and 3 point pinch: Right: 2 lbs, Left: 3 lbs  COORDINATION: 9 Hole Peg test: Right: 37 sec; Left: 37 sec  SENSATION: Light touch: WFL Stereognosis: WFL  EDEMA: N/A  MUSCLE TONE: Hypotonicity  COGNITION: Overall cognitive status: Within functional limits for tasks assessed  VISION: No change form baseline; wears glasses  PERCEPTION: WFL  PRAXIS: WFL                                                                                                                            TREATMENT DATE: 09/30/23   Manual therapy:  -STM were performed to the bilateral hands for carpal and metacarpal spread stretches -Manual therapy was performed independent of, and in preparation for therapeutic Ex.    Therapeutic Exercise: -Performed Passive MP extension, and Passive PIP and DIP flexion all digits of each hand. -AAROM was performed in preparation for formulating a full composite fist. -Performed AROM for intrinsic stretches in preparation for formulating a  full composite fist with PROM to the end range. -Lateral, and 3pt. Pinch strengthening using yellow, red, green, and blue,and black level resistive clips     PATIENT EDUCATION: Education details:  Person educated: Patient Education method: Programmer, multimedia, Facilities manager, Verbal cues, and Handouts Education comprehension: verbalized understanding, returned demonstration, verbal cues required, tactile cues required, and needs further education  HOME EXERCISE PROGRAM:  Yellow theraputty, self passive ROM bilat hands  GOALS: Goals reviewed with patient? Yes  SHORT TERM GOALS: Target date: 11/06/2023     Pt. Will be independent with HEPs for hand strength, and FMC skills Baseline:Eval: No current HEP Goal status: INITIAL  LONG TERM GOALS: Target date: 12/18/2023    Pt. Will improve  bilateral grip strength by 5# to be able to securely hold a weighted items. Baseline: Eval: R: 18#, L: 18# Pt. Is unable to hold weighted items, and casserole dishes. Goal status: INITIAL  2.  Pt. Will improve bilateral pinch strength to be able to hold and use a peeler Baseline: Lateral pinch: Right: 6 lbs, Left: 5 lbs, and 3 point pinch: Right: 2 lbs, Left: 3 lbs Goal status: INITIAL  3.  Pt. Will improve Bilateral hand coordination skills to bee able to pick up coins/small items off the surface of the table. Baseline: Eval: 9 Hole Peg test: Right: 37 sec; Left: 37 sec Goal status: INITIAL  4.  Pt. Will  demonstrate 3 energy conservation/work simplification strategies for home management tasks. Baseline: Eval: Education to be provided. Goal status: INITIAL  5.  Pt. Will write a 3 sentence paragraph with 100% legibility in preparation for writing written correspondence Baseline: Eval: 75% legibility for name only. Goal status: INITIAL  ASSESSMENT: CLINICAL IMPRESSION:   Pt. Tolerated ROM measurements, and STM well today. Pt. required cues, and assist for lateral, and 3pt. Pinch position during pinch strengthening exercises. Pt. Requires manual assist to perform intrinsic stretches  in preparation for formulating a composite fist. Pt. continues to present with bilateral hand weakness, and limited ability to actively formulate a full composite fist. Pt. Continues to benefit from OT services to work on improving BUE strength, and coordination skills in order to improve and maximize independence with ADLs, and IADLs.  PERFORMANCE DEFICITS: in functional skills including ADLs, IADLs, coordination, dexterity, proprioception, ROM, strength, pain, Fine motor control, Gross motor control, decreased knowledge of precautions, and UE functional use, cognitive skills including , and psychosocial skills including coping strategies, environmental adaptation, habits, and routines and behaviors.    IMPAIRMENTS: are limiting patient from ADLs, IADLs, and leisure.   CO-MORBIDITIES: may have co-morbidities  that affects occupational performance. Patient will benefit from skilled OT to address above impairments and improve overall function.  MODIFICATION OR ASSISTANCE TO COMPLETE EVALUATION: Min-Moderate modification of tasks or assist with assess necessary to complete an evaluation.  OT OCCUPATIONAL PROFILE AND HISTORY: Detailed assessment: Review of records and additional review of physical, cognitive, psychosocial history related to current functional performance.  CLINICAL DECISION MAKING: Moderate - several treatment options, min-mod task modification necessary  REHAB POTENTIAL: Good  EVALUATION COMPLEXITY: Moderate    PLAN:  OT FREQUENCY: 2x/week  OT DURATION: 12 weeks  PLANNED INTERVENTIONS: 97168 OT Re-evaluation, 97535 self care/ADL training, 02889 therapeutic exercise, 97530 therapeutic activity, 97112 neuromuscular re-education, 97140 manual therapy, 97018 paraffin, 02989 moist heat, 97034 contrast bath, and 97033 iontophoresis, Orthotic fitting  RECOMMENDED OTHER SERVICES: PT  CONSULTED AND AGREED WITH PLAN OF CARE: Patient  PLAN FOR NEXT SESSION: Treatment  Richardson Otter, MS, OTR/L   09/30/2023, 12:04 PM

## 2023-10-06 ENCOUNTER — Ambulatory Visit: Admitting: Occupational Therapy

## 2023-10-06 ENCOUNTER — Ambulatory Visit

## 2023-10-06 DIAGNOSIS — R29898 Other symptoms and signs involving the musculoskeletal system: Secondary | ICD-10-CM | POA: Diagnosis not present

## 2023-10-06 DIAGNOSIS — M6281 Muscle weakness (generalized): Secondary | ICD-10-CM

## 2023-10-06 DIAGNOSIS — R262 Difficulty in walking, not elsewhere classified: Secondary | ICD-10-CM

## 2023-10-06 DIAGNOSIS — R278 Other lack of coordination: Secondary | ICD-10-CM

## 2023-10-06 DIAGNOSIS — R2689 Other abnormalities of gait and mobility: Secondary | ICD-10-CM

## 2023-10-06 NOTE — Therapy (Signed)
 OUTPATIENT PHYSICAL THERAPY TREATMENT   Patient Name: Kim Hayes MRN: [id number] DOB:1945-11-04, 78 y.o., female Today's Date: 10/06/2023   PCP: Sadie Manna, MD  REFERRING PROVIDER: Sadie Manna, MD   END OF SESSION:    PT End of Session - 10/06/23 1103     Visit Number 7    Number of Visits 25    Date for PT Re-Evaluation 12/08/23    Authorization Type BCBS Medicare    Progress Note Due on Visit 10    PT Start Time 1100    PT Stop Time 1140    PT Time Calculation (min) 40 min    Equipment Utilized During Treatment Gait belt    Activity Tolerance Patient tolerated treatment well;No increased pain;Patient limited by fatigue    Behavior During Therapy [facility] for tasks assessed/performed             Past Medical History:  Diagnosis Date   Hypertension    No past surgical history on file. There are no active problems to display for this patient.  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 [name]   PERTINENT HISTORY: Pt reports that she has been having some L foot drop for approximately one year.  Pt just started wearing the brace ~3-4 months ago as it was advised by an MD with [facility] in [place]. Pt is seeking therapy for the foot drop to see if she can improve it with therapy. Pt reports that she first started having  difficulty with grasping items with the UE's and then eventually progressed to having foot drop in the L side.  Pt reports the MD did educated her slightly on the body myositis diagnosis and that it would eventually cause her muscles to give out.Pt has hx of HTN and new diagnosis of body myositis.  PAIN:  Are you having pain? No  PRECAUTIONS: None  RED FLAGS: None   WEIGHT BEARING RESTRICTIONS: No  FALLS: Has patient fallen in last 6 months?  Yes. Number of falls 1.  Pt reports she was unable to pick the L foot up in December and she broke some of her ribs during the fall.  LIVING ENVIRONMENT: Lives with: lives with their spouse Lives in: House/apartment Stairs: Pt reports having one step from the porch into the living room. Has following equipment at home: Single point cane and Walker - 2 wheeled  PLOF: Independent  PATIENT GOALS: Strengthen muscles in order to improve walking ability.  OBJECTIVE:  Note: Objective measures were completed at Evaluation unless otherwise noted.                                                                                                                             TREATMENT DATE: 10/06/23 -STS from chair hands free x12 -eyes closed balance x60sec -STS from chair hands free x12 -eyes closed balance on foam x60sec -rise from chair (ad lib) AMB 67ft to target on floor, grab item, carry back to chair, sit down, repeat x10;  -2lb AW donned, 12 minutes continuous AMB overground, no device, including 216ft in grass in healing garden  -alternate stepping forward, backward, side stepping c 5kg ball 2lb AW    PATIENT EDUCATION: Education details: Pt educated on role of PT and services provided during current POC, along with prognosis and information about the clinic. Person educated: Patient Education method: Explanation, Demonstration, and Tactile cues Education comprehension: verbalized understanding  HOME EXERCISE PROGRAM: Access Code:  [email or web address] URL: [email or web address] Date: 09/24/2023 Prepared by: Chiquita Silvan  Exercises - Seated Ankle Alphabet  - 1 x daily - 7 x weekly - 2 sets - 10 reps - 5 hold - Single Leg Heel Raise  - 1 x daily - 7 x weekly - 2 sets - 10 reps - 5 hold - Standing Ankle Dorsiflexion with Table Support  - 1 x daily - 7 x weekly - 2 sets - 10 reps - 5 hold - Single Leg Stance  - 1 x daily - 7 x weekly - 2 sets - 2 reps - 30 hold - Seated Ankle Plantar Flexion with Resistance Loop  - 1 x daily - 7 x weekly - 2 sets - 10 reps - 5 hold - Seated Ankle Inversion with Resistance  - 1 x daily - 7 x weekly - 2 sets - 10 reps - 5 hold - Seated Toe Raise  - 1 x daily - 7 x weekly - 3 sets - 10 reps - Supine Ankle Eversion AROM  - 1 x daily - 7 x weekly - 3 sets - 10 reps  GOALS: Goals reviewed with patient? Yes  SHORT TERM GOALS: Target date: 10/13/2023 Pt will be independent with HEP in order to demonstrate increased ability to perform tasks related to occupation/hobbies. Baseline: HEP generated at evaluation Goal status: INITIAL  LONG TERM GOALS: Target date: 12/08/2023 1.  Patient (> 3 years old) will complete five times sit to stand test in < 12 seconds  indicating an increased LE strength and improved balance. Baseline: 13.2 sec Goal status: INITIAL  2.  Patient will increase FADI score to equal to or greater than  94%   to demonstrate statistically significant improvement in mobility and quality of life.  Baseline: 88% Goal status: INITIAL   3.  Patient will increase FGA score by > 4 points to demonstrate decreased fall risk during functional activities. Baseline: 12/30 Goal status: INITIAL   4.  Patient will reduce timed up and go to <10 seconds to reduce fall risk and demonstrate improved transfer/gait ability. Baseline: 10.18 Goal status: INITIAL  5.  Patient will increase 10 meter walk test to >1.58m/s as to improve gait speed for better community ambulation and to reduce  fall risk. Baseline: 10.6 sec; 0.94 m/s Goal status: INITIAL  6.  Patient will increase six minute walk test distance to 392 m for progression to meet age matched norm and improve gait ability Baseline: 1243 feet (378.8 meters, Avg speed 1.05 m/s) Goal status: INITIAL   ASSESSMENT:  CLINICAL IMPRESSION:   Continued with strengthing and functional mobility, adding in balance elements to improve overall function. Pt performing well indeed, no frank LOB in session. Pt feels close to ready for DC, would like 2 more visits then plan on DC after next Tuesday. Pt will continue to benefit from continued strengthening of the LE along with more dynamic stability/balance related exercises in the subsequent sessions.   OBJECTIVE IMPAIRMENTS: Abnormal gait, decreased activity tolerance, decreased balance, decreased endurance, decreased mobility, difficulty walking, decreased ROM, and decreased strength.   ACTIVITY LIMITATIONS: carrying, lifting, squatting, and bathing  PARTICIPATION LIMITATIONS: meal prep, cleaning, laundry, personal finances, shopping, community activity, and yard work  PERSONAL FACTORS: Time since onset of injury/illness/exacerbation and 1-2 comorbidities: HTN, DM2, Inclusion body myositis are also affecting patient's functional outcome.   REHAB POTENTIAL: Good  CLINICAL DECISION MAKING: Stable/uncomplicated  EVALUATION COMPLEXITY: Low  PLAN:  PT FREQUENCY: 2x/week  PT DURATION: 12 weeks  PLANNED INTERVENTIONS: 97750- Physical Performance Testing, 97110-Therapeutic exercises, 97530- Therapeutic activity, 97112- Neuromuscular re-education, 97535- Self Care, 02859- Manual therapy, [phone]- Gait training, [phone]- Electrical stimulation (manual), [phone] (1-2 muscles), 20561 (3+ muscles)- Dry Needling, Balance training, and Stair training  PLAN FOR NEXT SESSION:   Dynamic balance interventions stepping over object, stair navigation, gait with eyes closed, gait with narrow BOS, gait  with head turns, gait with pivot turn LE strengthening   11:05 AM, 10/06/23 Peggye JAYSON Linear, PT, DPT Physical Therapist - [facility]  Outpatient Physical Therapy- Main Campus [phone]

## 2023-10-06 NOTE — Therapy (Signed)
 OUTPATIENT OCCUPATIONAL THERAPY NEURO TREATMENT NOTE  Patient Name: Kim Hayes MRN: [id number] DOB:1945-09-25, 78 y.o., female Today's Date: 10/06/2023  REFERRING PROVIDER: Sadie Manna, MD  END OF SESSION:  OT End of Session - 10/06/23 1628     Visit Number 4    Number of Visits 24    Date for OT Re-Evaluation 12/17/23    OT Start Time 1015    OT Stop Time 1100    OT Time Calculation (min) 45 min    Activity Tolerance Patient tolerated treatment well    Behavior During Therapy WFL for tasks assessed/performed         Past Medical History:  Diagnosis Date   Hypertension    No past surgical history on file. There are no active problems to display for this patient.  ONSET DATE: 04/2021  REFERRING DIAG: Inclusion Body Myositis  THERAPY DIAG:  Muscle weakness (generalized)  Other lack of coordination  Rationale for Evaluation and Treatment: Rehabilitation  SUBJECTIVE:  SUBJECTIVE STATEMENT: Pt. reports doing well today.  Pt accompanied by: self  PERTINENT HISTORY: Pt. is an 55 y. o. female who was experiencing muscular weakness distally in the BUEs which started in 04/2021. Pt. was diagnosed with Inclusion Body Myositis  approximately one year ago at [facility]. PMHx includes: HTN, Borderline DM.  PRECAUTIONS: None  WEIGHT BEARING RESTRICTIONS: No  PAIN:  Are you having pain? Yes. 3/10 at the right thumb CMC joint- 0/10 after treatment  FALLS: Has patient fallen in last 6 months? No  LIVING ENVIRONMENT: Lives with: lives with their spouse with medical issues as well. Lives in: Condo Stairs: One small step  Has following equipment at home: Single point cane and Walker - 2 wheeled  PLOF:  Independent  PATIENT GOALS: To regain strength in bilateral hands.  OBJECTIVE:  Note: Objective measures were completed at Evaluation unless otherwise noted.  HAND DOMINANCE: Right  ADLs: Eating: Independent, increased time required, some difficulty  cutting food, has difficulty hold a cup-uses both hands to complete, Grooming: Difficulty opening [place], Independent  UB Dressing: Independent LB Dressing: independent, difficulty grasping small zippers. Toileting: Independent  Bathing: Independent Tub Shower transfers:  Independent   IADLs: Shopping:  Has difficulty removing items from her wallet. Light housekeeping: Independent, difficulty holding a laundry basket, applying the fitted sheet around the corner of the mattress. Meal Prep: independent, difficulty using kitchen cooking utensils transporting heavier items Community mobility: Driving Medication management: Independent  Financial management: No change Handwriting: 75% legible Leisure/Hobbies: Collected, and sold pottery, Physiological scientist Work History: Banking-retired  MOBILITY STATUS: Independent  ACTIVITY TOLERANCE: Activity tolerance: fatigues after 20-30 min. of activity  FUNCTIONAL OUTCOME MEASURES: MAM-20 score: 71/80  UPPER EXTREMITY ROM:    Active ROM Right  WFL Left WFL  Shoulder flexion    Shoulder abduction    Shoulder adduction    Shoulder extension    Shoulder internal rotation    Shoulder external rotation    Elbow flexion    Elbow extension    Wrist flexion    Wrist extension    Wrist ulnar deviation    Wrist radial deviation    Wrist pronation    Wrist supination    (Blank rows = not tested)   Eval: Fisting: R: Able to achieve 75% , L: 50%  Right: Digit hyperextension of the PIPs, and DIPs  Left: Digit hyperextension in the  3rd, and 4th digits  UPPER EXTREMITY MMT:     MMT Right eval Left  eval  Shoulder flexion 5/5 5/5  Shoulder abduction 4-/5 5/5  Shoulder adduction    Shoulder extension    Shoulder internal rotation    Shoulder external rotation    Middle trapezius    Lower trapezius    Elbow flexion 5/5 5/5  Elbow extension 5/5 5/5  Wrist flexion 5/5 5/5  Wrist extension 5/5 5/5  Wrist ulnar deviation     Wrist radial deviation    Wrist pronation 5/5 5/5  Wrist supination 5/5 5/5  (Blank rows = not tested)  HAND FUNCTION: Grip strength: Right: 18 lbs; Left: 18 lbs, Lateral pinch: Right: 6 lbs, Left: 5 lbs, and 3 point pinch: Right: 2 lbs, Left: 3 lbs  COORDINATION: 9 Hole Peg test: Right: 37 sec; Left: 37 sec  SENSATION: Light touch: WFL Stereognosis: WFL  EDEMA: N/A  MUSCLE TONE: Hypotonicity  COGNITION: Overall cognitive status: Within functional limits for tasks assessed  VISION: No change form baseline; wears glasses  PERCEPTION: WFL  PRAXIS: WFL                                                                                                                            TREATMENT DATE: 10/06/23   Contrast Bath:   Contrasting heat pack for 3 min. followed by cold pack for 1 min. for 3 trials ending with 3 min. of heat for a total of 15 min. to the bilateral hands 2/2 pain, edema, and stiffness. Contrast bath was performed in preparation for manual therapy, and there. Ex.     Manual therapy:  -STM were performed to the bilateral hands for carpal and metacarpal spread stretches -Manual therapy was performed independent of, and in preparation for therapeutic Ex.    Therapeutic Exercise:  -Performed Passive MP extension, and Passive PIP and DIP flexion all digits of each hand. POLLY was performed in preparation for formulating a full composite fist. -Performed AROM for intrinsic stretches in preparation for formulating a  full composite fist with PROM to the end range.  Orthotic Fitting:  -Assessed fit with the thumb CMC hand brace on the right hand for increased support when transporting items 2/2 pain in the thumb CMC joint. -Pt. Was able to independently donn, and doff the splint -No redness noted with the initial fit assessment.  PATIENT EDUCATION: Education details:  Person educated: Patient Education method: Programmer, multimedia, Facilities manager, Verbal cues, and  Handouts Education comprehension: verbalized understanding, returned demonstration, verbal cues required, tactile cues required, and needs further education  HOME EXERCISE PROGRAM:  Yellow theraputty, self passive ROM bilat hands  GOALS: Goals reviewed with patient? Yes  SHORT TERM GOALS: Target date: 11/06/2023     Pt. Will be independent with HEPs for hand strength, and FMC skills Baseline:Eval: No current HEP Goal status: INITIAL  LONG TERM GOALS: Target date: 12/18/2023    Pt. Will improve bilateral grip strength by 5# to be able to securely hold a weighted items. Baseline: Eval: R: 18#, L: 18#  Pt. Is unable to hold weighted items, and casserole dishes. Goal status: INITIAL  2.  Pt. Will improve bilateral pinch strength to be able to hold and use a peeler Baseline: Lateral pinch: Right: 6 lbs, Left: 5 lbs, and 3 point pinch: Right: 2 lbs, Left: 3 lbs Goal status: INITIAL  3.  Pt. Will improve Bilateral hand coordination skills to bee able to pick up coins/small items off the surface of the table. Baseline: Eval: 9 Hole Peg test: Right: 37 sec; Left: 37 sec Goal status: INITIAL  4.  Pt. Will demonstrate 3 energy conservation/work simplification strategies for home management tasks. Baseline: Eval: Education to be provided. Goal status: INITIAL  5.  Pt. Will write a 3 sentence paragraph with 100% legibility in preparation for writing written correspondence Baseline: Eval: 75% legibility for name only. Goal status: INITIAL  ASSESSMENT: CLINICAL IMPRESSION:    Upon arrival, Pt. presented with pain in the right thumb CMC joint. Following treatment pain improved to 0/10. Pt. tolerated fitting for thumb CMC brace. Pt. required cues for visual demonstration of proper ther. ex technique for follow through at home.  Pt. Requires assist, and mirroring for intrinsic stretches. Pt. continues to benefit from OT services to work on improving BUE strength, and coordination skills in  order to improve and maximize independence with ADLs, and IADLs.  PERFORMANCE DEFICITS: in functional skills including ADLs, IADLs, coordination, dexterity, proprioception, ROM, strength, pain, Fine motor control, Gross motor control, decreased knowledge of precautions, and UE functional use, cognitive skills including , and psychosocial skills including coping strategies, environmental adaptation, habits, and routines and behaviors.   IMPAIRMENTS: are limiting patient from ADLs, IADLs, and leisure.   CO-MORBIDITIES: may have co-morbidities  that affects occupational performance. Patient will benefit from skilled OT to address above impairments and improve overall function.  MODIFICATION OR ASSISTANCE TO COMPLETE EVALUATION: Min-Moderate modification of tasks or assist with assess necessary to complete an evaluation.  OT OCCUPATIONAL PROFILE AND HISTORY: Detailed assessment: Review of records and additional review of physical, cognitive, psychosocial history related to current functional performance.  CLINICAL DECISION MAKING: Moderate - several treatment options, min-mod task modification necessary  REHAB POTENTIAL: Good  EVALUATION COMPLEXITY: Moderate    PLAN:  OT FREQUENCY: 2x/week  OT DURATION: 12 weeks  PLANNED INTERVENTIONS: 97168 OT Re-evaluation, 97535 self care/ADL training, 02889 therapeutic exercise, 97530 therapeutic activity, 97112 neuromuscular re-education, 97140 manual therapy, 97018 paraffin, 02989 moist heat, 97034 contrast bath, and 97033 iontophoresis, Orthotic fitting  RECOMMENDED OTHER SERVICES: PT  CONSULTED AND AGREED WITH PLAN OF CARE: Patient  PLAN FOR NEXT SESSION: Treatment  Richardson Otter, MS, OTR/L   10/06/2023, 4:33 PM

## 2023-10-08 ENCOUNTER — Ambulatory Visit: Admitting: Occupational Therapy

## 2023-10-08 ENCOUNTER — Ambulatory Visit

## 2023-10-08 DIAGNOSIS — M6281 Muscle weakness (generalized): Secondary | ICD-10-CM

## 2023-10-08 DIAGNOSIS — R278 Other lack of coordination: Secondary | ICD-10-CM

## 2023-10-08 DIAGNOSIS — R262 Difficulty in walking, not elsewhere classified: Secondary | ICD-10-CM

## 2023-10-08 DIAGNOSIS — R2689 Other abnormalities of gait and mobility: Secondary | ICD-10-CM

## 2023-10-08 DIAGNOSIS — R29898 Other symptoms and signs involving the musculoskeletal system: Secondary | ICD-10-CM

## 2023-10-08 NOTE — Therapy (Signed)
 OUTPATIENT OCCUPATIONAL THERAPY NEURO TREATMENT NOTE  Patient Name: Kim Hayes MRN: [id number] DOB:12/19/1945, 78 y.o., female Today's Date: 10/08/2023  REFERRING PROVIDER: Sadie Manna, MD  END OF SESSION:  OT End of Session - 10/08/23 1029     Visit Number 5    Number of Visits 24    Date for Recertification  12/17/23    OT Start Time 1015    OT Stop Time 1100    OT Time Calculation (min) 45 min    Activity Tolerance Patient tolerated treatment well    Behavior During Therapy WFL for tasks assessed/performed         Past Medical History:  Diagnosis Date   Hypertension    No past surgical history on file. There are no active problems to display for this patient.  ONSET DATE: 04/2021  REFERRING DIAG: Inclusion Body Myositis  THERAPY DIAG:  Muscle weakness (generalized)  Rationale for Evaluation and Treatment: Rehabilitation  SUBJECTIVE:  SUBJECTIVE STATEMENT: Pt. reports doing well today.  Pt accompanied by: self  PERTINENT HISTORY: Pt. is an 61 y. o. female who was experiencing muscular weakness distally in the BUEs which started in 04/2021. Pt. was diagnosed with Inclusion Body Myositis  approximately one year ago at [facility]. PMHx includes: HTN, Borderline DM.  PRECAUTIONS: None  WEIGHT BEARING RESTRICTIONS: No  PAIN:  Are you having pain? Yes. 3/10 at the right thumb CMC joint- 0/10 after treatment  FALLS: Has patient fallen in last 6 months? No  LIVING ENVIRONMENT: Lives with: lives with their spouse with medical issues as well. Lives in: Condo Stairs: One small step  Has following equipment at home: Single point cane and Walker - 2 wheeled  PLOF:  Independent  PATIENT GOALS: To regain strength in bilateral hands.  OBJECTIVE:  Note: Objective measures were completed at Evaluation unless otherwise noted.  HAND DOMINANCE: Right  ADLs: Eating: Independent, increased time required, some difficulty cutting food, has difficulty  hold a cup-uses both hands to complete, Grooming: Difficulty opening [place], Independent  UB Dressing: Independent LB Dressing: independent, difficulty grasping small zippers. Toileting: Independent  Bathing: Independent Tub Shower transfers:  Independent   IADLs: Shopping:  Has difficulty removing items from her wallet. Light housekeeping: Independent, difficulty holding a laundry basket, applying the fitted sheet around the corner of the mattress. Meal Prep: independent, difficulty using kitchen cooking utensils transporting heavier items Community mobility: Driving Medication management: Independent  Financial management: No change Handwriting: 75% legible Leisure/Hobbies: Collected, and sold pottery, Physiological scientist Work History: Banking-retired  MOBILITY STATUS: Independent  ACTIVITY TOLERANCE: Activity tolerance: fatigues after 20-30 min. of activity  FUNCTIONAL OUTCOME MEASURES: MAM-20 score: 71/80  UPPER EXTREMITY ROM:    Active ROM Right  WFL Left WFL  Shoulder flexion    Shoulder abduction    Shoulder adduction    Shoulder extension    Shoulder internal rotation    Shoulder external rotation    Elbow flexion    Elbow extension    Wrist flexion    Wrist extension    Wrist ulnar deviation    Wrist radial deviation    Wrist pronation    Wrist supination    (Blank rows = not tested)   Eval: Fisting: R: Able to achieve 75% , L: 50%  Right: Digit hyperextension of the PIPs, and DIPs  Left: Digit hyperextension in the  3rd, and 4th digits  UPPER EXTREMITY MMT:     MMT Right eval Left eval  Shoulder flexion 5/5  5/5  Shoulder abduction 4-/5 5/5  Shoulder adduction    Shoulder extension    Shoulder internal rotation    Shoulder external rotation    Middle trapezius    Lower trapezius    Elbow flexion 5/5 5/5  Elbow extension 5/5 5/5  Wrist flexion 5/5 5/5  Wrist extension 5/5 5/5  Wrist ulnar deviation    Wrist radial deviation    Wrist  pronation 5/5 5/5  Wrist supination 5/5 5/5  (Blank rows = not tested)  HAND FUNCTION: Grip strength: Right: 18 lbs; Left: 18 lbs, Lateral pinch: Right: 6 lbs, Left: 5 lbs, and 3 point pinch: Right: 2 lbs, Left: 3 lbs  COORDINATION: 9 Hole Peg test: Right: 37 sec; Left: 37 sec  SENSATION: Light touch: WFL Stereognosis: WFL  EDEMA: N/A  MUSCLE TONE: Hypotonicity  COGNITION: Overall cognitive status: Within functional limits for tasks assessed  VISION: No change form baseline; wears glasses  PERCEPTION: WFL  PRAXIS: WFL                                                                                                                            TREATMENT DATE: 10/08/23   Paraffin Bath:   Paraffin bath to the bilateral hands with a towel wrap for 10 min. 2/2 pain, and stiffness. Paraffin Bath was performed in preparation for manual therapy, and ROM.   Manual therapy:  -STM were performed to the bilateral hands for carpal and metacarpal spread stretches -Manual therapy was performed independent of, and in preparation for therapeutic Ex.    Therapeutic Exercise:  -Performed Passive MP extension, and Passive PIP and DIP flexion all digits of each hand. POLLY was performed in preparation for formulating a full composite fist. -Performed AROM for intrinsic stretches in preparation for formulating a  full composite fist with PROM to the end range.  Orthotic Fitting:  -Assessed fit with Oval-8 rings for the bilateral 3rd, and 4th digits 2/2 PIP hyperextension. -Oval-8 ring splints  size 9 for the bilateral 4th digits, and size 10 for bilateral 3rd digits -Pt. Was able to demonstrate independence with application, and removal  PATIENT EDUCATION: Education details: Thumb CMC brace, Oval-8 splints Person educated: Patient Education method: Explanation, Demonstration, Verbal cues, and Handouts Education comprehension: verbalized understanding, returned demonstration, verbal cues  required, tactile cues required, and needs further education  HOME EXERCISE PROGRAM:  Yellow theraputty, self passive ROM bilat hands   GOALS: Goals reviewed with patient? Yes  SHORT TERM GOALS: Target date: 11/06/2023     Pt. Will be independent with HEPs for hand strength, and FMC skills Baseline:Eval: No current HEP Goal status: INITIAL  LONG TERM GOALS: Target date: 12/18/2023    Pt. Will improve bilateral grip strength by 5# to be able to securely hold a weighted items. Baseline: Eval: R: 18#, L: 18# Pt. Is unable to hold weighted items, and casserole dishes. Goal status: INITIAL  2.  Pt. Will improve bilateral pinch strength to  be able to hold and use a peeler Baseline: Lateral pinch: Right: 6 lbs, Left: 5 lbs, and 3 point pinch: Right: 2 lbs, Left: 3 lbs Goal status: INITIAL  3.  Pt. Will improve Bilateral hand coordination skills to bee able to pick up coins/small items off the surface of the table. Baseline: Eval: 9 Hole Peg test: Right: 37 sec; Left: 37 sec Goal status: INITIAL  4.  Pt. Will demonstrate 3 energy conservation/work simplification strategies for home management tasks. Baseline: Eval: Education to be provided. Goal status: INITIAL  5.  Pt. Will write a 3 sentence paragraph with 100% legibility in preparation for writing written correspondence Baseline: Eval: 75% legibility for name only. Goal status: INITIAL  ASSESSMENT: CLINICAL IMPRESSION:  Pt. reports the Thumb CMC brace that she received at the last visit is helping. Pt. was able to tolerate initial fitting for Oval-8 splints this morning. Pt. responded well to the Paraffin Bath for the bilateral hands, manual therapy, and exercises. Pt. continues to be limited with formulating a full composite fist. Pt. continues to benefit from OT services to work on improving BUE strength, and coordination skills in order to improve and maximize independence with ADLs, and IADLs.  PERFORMANCE DEFICITS: in  functional skills including ADLs, IADLs, coordination, dexterity, proprioception, ROM, strength, pain, Fine motor control, Gross motor control, decreased knowledge of precautions, and UE functional use, cognitive skills including , and psychosocial skills including coping strategies, environmental adaptation, habits, and routines and behaviors.   IMPAIRMENTS: are limiting patient from ADLs, IADLs, and leisure.   CO-MORBIDITIES: may have co-morbidities  that affects occupational performance. Patient will benefit from skilled OT to address above impairments and improve overall function.  MODIFICATION OR ASSISTANCE TO COMPLETE EVALUATION: Min-Moderate modification of tasks or assist with assess necessary to complete an evaluation.  OT OCCUPATIONAL PROFILE AND HISTORY: Detailed assessment: Review of records and additional review of physical, cognitive, psychosocial history related to current functional performance.  CLINICAL DECISION MAKING: Moderate - several treatment options, min-mod task modification necessary  REHAB POTENTIAL: Good  EVALUATION COMPLEXITY: Moderate    PLAN:  OT FREQUENCY: 2x/week  OT DURATION: 12 weeks  PLANNED INTERVENTIONS: 97168 OT Re-evaluation, 97535 self care/ADL training, 02889 therapeutic exercise, 97530 therapeutic activity, 97112 neuromuscular re-education, 97140 manual therapy, 97018 paraffin, 02989 moist heat, 97034 contrast bath, and 97033 iontophoresis, Orthotic fitting  RECOMMENDED OTHER SERVICES: PT  CONSULTED AND AGREED WITH PLAN OF CARE: Patient  PLAN FOR NEXT SESSION: Treatment  Richardson Otter, MS, OTR/L   10/08/2023, 10:33 AM

## 2023-10-08 NOTE — Therapy (Signed)
 OUTPATIENT PHYSICAL THERAPY TREATMENT   Patient Name: Kim Hayes MRN: [id number] DOB:[birth year], 78 y.o., female Today's Date: 10/08/2023   PCP: Sadie Manna, MD  REFERRING PROVIDER: Sadie Manna, MD   END OF SESSION:    PT End of Session - 10/08/23 1115     Visit Number 8    Number of Visits 25    Date for Recertification  12/08/23    Authorization Type BCBS Medicare    Progress Note Due on Visit 10    PT Start Time 1105    PT Stop Time 1145    PT Time Calculation (min) 40 min    Activity Tolerance Patient tolerated treatment well;No increased pain;Patient limited by fatigue    Behavior During Therapy [facility] for tasks assessed/performed             Past Medical History:  Diagnosis Date   Hypertension    No past surgical history on file. There are no active problems to display for this patient.  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 [name]   PERTINENT HISTORY: Pt reports that she has been having some L foot drop for approximately one year.  Pt just started wearing the brace ~3-4 months ago as it was advised by an MD with [facility] in [place]. Pt is seeking therapy for the foot drop to see if she can improve it with therapy. Pt reports that she first started having difficulty with grasping items with the UE's and  then eventually progressed to having foot drop in the L side.  Pt reports the MD did educated her slightly on the body myositis diagnosis and that it would eventually cause her muscles to give out.Pt has hx of HTN and new diagnosis of body myositis.  PAIN:  Are you having pain? No  PRECAUTIONS: None  RED FLAGS: None   WEIGHT BEARING RESTRICTIONS: No  FALLS: Has patient fallen in last 6 months? Yes. Number of falls 1.  Pt reports she  was unable to pick the L foot up in December and she broke some of her ribs during the fall.  LIVING ENVIRONMENT: Lives with: lives with their spouse Lives in: House/apartment Stairs: Pt reports having one step from the porch into the living room. Has following equipment at home: Single point cane and Walker - 2 wheeled  PLOF: Independent  PATIENT GOALS: Strengthen muscles in order to improve walking ability.  OBJECTIVE:  Note: Objective measures were completed at Evaluation unless otherwise noted.                                                                                                                             TREATMENT DATE: 10/08/23 -TUG -5xSTS -FGA -FADI -cable resisted walking, 4 directions, 12.5lb 19ft total  -alterante forward backward walking with band around knees  PATIENT EDUCATION: Education details: Pt educated on role of PT and services provided during current POC, along with prognosis and information about the clinic. Person educated: Patient Education method: Explanation, Demonstration, and Tactile cues Education comprehension: verbalized understanding  HOME EXERCISE PROGRAM: Access Code: [email or web address] URL: [email or web address] Date: 09/24/2023 Prepared by: Chiquita Silvan  Exercises - Seated Ankle Alphabet  - 1 x daily - 7 x weekly - 2 sets - 10 reps - 5 hold - Single Leg Heel Raise  - 1 x daily - 7 x weekly - 2 sets - 10 reps - 5 hold - Standing Ankle Dorsiflexion with Table Support  - 1 x daily - 7 x  weekly - 2 sets - 10 reps - 5 hold - Single Leg Stance  - 1 x daily - 7 x weekly - 2 sets - 2 reps - 30 hold - Seated Ankle Plantar Flexion with Resistance Loop  - 1 x daily - 7 x weekly - 2 sets - 10 reps - 5 hold - Seated Ankle Inversion with Resistance  - 1 x daily - 7 x weekly - 2 sets - 10 reps - 5 hold - Seated Toe Raise  - 1 x daily - 7 x weekly - 3 sets - 10 reps - Supine Ankle Eversion AROM  - 1 x daily - 7 x weekly - 3 sets - 10 reps  GOALS: Goals reviewed with patient? Yes  SHORT TERM GOALS: Target date: 10/13/2023 Pt will be independent with HEP in order to demonstrate increased ability to perform tasks related to occupation/hobbies. Baseline: HEP generated at evaluation Goal status: INITIAL  LONG TERM GOALS: Target date: 12/08/2023 1.  Patient (> 57 years old) will complete five times sit to stand test in < 12 seconds indicating an increased LE strength and improved balance. Baseline: 13.2 sec; 10/08/23: 8.12sec Goal status: ACHIEVED  2.  Patient will increase FADI score to equal to or greater than  94%   to demonstrate statistically significant improvement in mobility and quality of life.  Baseline: 88%; 10/08/23: 94%  Goal status: ACHIEVED    3.  Patient will increase FGA  score by > 4 points to demonstrate decreased fall risk during functional activities. Baseline: 12/30'; 10/08/23: 26/30 Goal status: ACHEVED   4.  Patient will reduce timed up and go to <10 seconds to reduce fall risk and demonstrate improved transfer/gait ability. Baseline: 10.18; 10/08/23: 8.89 sec c AFO, no device Goal status: ACHIEVED  5.  Patient will increase 10 meter walk test to >1.87m/s as to improve gait speed for better community ambulation and to reduce fall risk. Baseline: 10.6 sec; 0.94 m/s; 10/08/23: 9.51sec (1.17m/s)  Goal status: ACHIEVED   6.  Patient will increase six minute walk test distance to 392 m for progression to meet age matched norm and improve gait ability Baseline: 1243 feet  (378.8 meters, Avg speed 1.05 m/s)9/18: achieved Goal status: ACHIEVED    ASSESSMENT:  CLINICAL IMPRESSION:   LT goals assessmed, allmet with fantastic improvements overall. Pt has 1 more visits shceduled then will be ready for DC at that time.  Pt will continue to benefit from continued strengthening of the LE along with more dynamic stability/balance related exercises in the subsequent sessions.   OBJECTIVE IMPAIRMENTS: Abnormal gait, decreased activity tolerance, decreased balance, decreased endurance, decreased mobility, difficulty walking, decreased ROM, and decreased strength.   ACTIVITY LIMITATIONS: carrying, lifting, squatting, and bathing  PARTICIPATION LIMITATIONS: meal prep, cleaning, laundry, personal finances, shopping, community activity, and yard work  PERSONAL FACTORS: Time since onset of injury/illness/exacerbation and 1-2 comorbidities: HTN, DM2, Inclusion body myositis are also affecting patient's functional outcome.   REHAB POTENTIAL: Good  CLINICAL DECISION MAKING: Stable/uncomplicated  EVALUATION COMPLEXITY: Low  PLAN:  PT FREQUENCY: 2x/week  PT DURATION: 12 weeks  PLANNED INTERVENTIONS: 97750- Physical Performance Testing, 97110-Therapeutic exercises, 97530- Therapeutic activity, 97112- Neuromuscular re-education, 97535- Self Care, 02859- Manual therapy, [phone]- Gait training, [phone]- Electrical stimulation (manual), [phone] (1-2 muscles), 20561 (3+ muscles)- Dry Needling, Balance training, and Stair training  PLAN FOR NEXT SESSION:   Last high level visit, then DC.    11:16 AM, 10/08/23 Peggye JAYSON Linear, PT, DPT Physical Therapist - [facility]  Outpatient Physical Therapy- Main Campus [phone]

## 2023-10-13 ENCOUNTER — Ambulatory Visit: Admitting: Occupational Therapy

## 2023-10-13 ENCOUNTER — Ambulatory Visit

## 2023-10-13 DIAGNOSIS — M6281 Muscle weakness (generalized): Secondary | ICD-10-CM

## 2023-10-13 DIAGNOSIS — R262 Difficulty in walking, not elsewhere classified: Secondary | ICD-10-CM

## 2023-10-13 DIAGNOSIS — R2689 Other abnormalities of gait and mobility: Secondary | ICD-10-CM

## 2023-10-13 DIAGNOSIS — R29898 Other symptoms and signs involving the musculoskeletal system: Secondary | ICD-10-CM

## 2023-10-13 DIAGNOSIS — R278 Other lack of coordination: Secondary | ICD-10-CM

## 2023-10-13 NOTE — Therapy (Signed)
 OUTPATIENT OCCUPATIONAL THERAPY NEURO TREATMENT/DISCHARGE NOTE  Patient Name: Kim Hayes MRN: [id number] DOB:May 21, 1945, 78 y.o., female Today's Date: 10/13/2023  REFERRING PROVIDER: Sadie Manna, MD  END OF SESSION:  OT End of Session - 10/13/23 1537     Visit Number 6    Number of Visits 24    Date for Recertification  12/17/23    OT Start Time 1400    OT Stop Time 1445    OT Time Calculation (min) 45 min    Activity Tolerance Patient tolerated treatment well    Behavior During Therapy [facility] for tasks assessed/performed         Past Medical History:  Diagnosis Date   Hypertension    No past surgical history on file. There are no active problems to display for this patient.  ONSET DATE: 04/2021  REFERRING DIAG: Inclusion Body Myositis  THERAPY DIAG:  Muscle weakness (generalized)  Rationale for Evaluation and Treatment: Rehabilitation  SUBJECTIVE:  SUBJECTIVE STATEMENT: Pt.  Reports independence with HEPs. Pt accompanied by: self  PERTINENT HISTORY: Pt. is an 78 y. o. female who was experiencing muscular weakness distally in the BUEs which started in 04/2021. Pt. was diagnosed with Inclusion Body Myositis  approximately one year ago at [facility]. PMHx includes: HTN, Borderline DM.  PRECAUTIONS: None  WEIGHT BEARING RESTRICTIONS: No  PAIN:  Are you having pain? No pain  FALLS: Has patient fallen in last 6 months? No  LIVING ENVIRONMENT: Lives with: lives with their spouse with medical issues as well. Lives in: Condo Stairs: One small step  Has following equipment at home: Single point cane and Walker - 2 wheeled  PLOF:  Independent  PATIENT GOALS: To regain strength in bilateral hands.  OBJECTIVE:  Note: Objective measures were completed at Evaluation unless otherwise noted.  HAND DOMINANCE: Right  ADLs: Eating: Independent, increased time required, some difficulty cutting food, has difficulty hold a cup-uses both hands to  complete, Grooming: Difficulty opening [place], Independent  UB Dressing: Independent LB Dressing: independent, difficulty grasping small zippers. Toileting: Independent  Bathing: Independent Tub Shower transfers:  Independent   IADLs: Shopping:  Has difficulty removing items from her wallet. Light housekeeping: Independent, difficulty holding a laundry basket, applying the fitted sheet around the corner of the mattress. Meal Prep: independent, difficulty using kitchen cooking utensils transporting heavier items Community mobility: Driving Medication management: Independent  Financial management: No change Handwriting: 80% legible Leisure/Hobbies: Collected, and sold pottery, Physiological scientist Work History: Banking-retired  MOBILITY STATUS: Independent  ACTIVITY TOLERANCE: Activity tolerance: fatigues after 20-30 min. of activity  FUNCTIONAL OUTCOME MEASURES: MAM-20 score: 71/80  UPPER EXTREMITY ROM:    Active ROM Right  WFL Left WFL  Shoulder flexion    Shoulder abduction    Shoulder adduction    Shoulder extension    Shoulder internal rotation    Shoulder external rotation    Elbow flexion    Elbow extension    Wrist flexion    Wrist extension    Wrist ulnar deviation    Wrist radial deviation    Wrist pronation    Wrist supination    (Blank rows = not tested)   Eval: Fisting: R: Able to achieve 75% , L: 50%  Right: Digit hyperextension of the PIPs, and DIPs  Left: Digit hyperextension in the  3rd, and 4th digits  UPPER EXTREMITY MMT:     MMT Right eval Left eval  Shoulder flexion 5/5 5/5  Shoulder abduction 4-/5 5/5  Shoulder adduction  Shoulder extension    Shoulder internal rotation    Shoulder external rotation    Middle trapezius    Lower trapezius    Elbow flexion 5/5 5/5  Elbow extension 5/5 5/5  Wrist flexion 5/5 5/5  Wrist extension 5/5 5/5  Wrist ulnar deviation    Wrist radial deviation    Wrist pronation 5/5 5/5  Wrist  supination 5/5 5/5  (Blank rows = not tested)  HAND FUNCTION: Grip strength: Right: 18 lbs; Left: 18 lbs, Lateral pinch: Right: 6 lbs, Left: 5 lbs, and 3 point pinch: Right: 2 lbs, Left: 3 lbs  COORDINATION: 9 Hole Peg test: Right: 37 sec; Left: 37 sec  SENSATION: Light touch: WFL Stereognosis: WFL  EDEMA: N/A  MUSCLE TONE: Hypotonicity  COGNITION: Overall cognitive status: Within functional limits for tasks assessed  VISION: No change form baseline; wears glasses  PERCEPTION: WFL  PRAXIS: WFL                                                                                                                            TREATMENT DATE: 10/13/23   Measurements were obtained, and goals were reviewed with the Pt.   Paraffin Bath:   Paraffin bath to the bilateral hands with a towel wrap for 10 min. 2/2 pain, and stiffness. Paraffin Bath was performed in preparation for manual therapy, and ROM.   Manual therapy:  -STM were performed to the bilateral hands for carpal and metacarpal spread stretches -Manual therapy was performed independent of, and in preparation for therapeutic Ex.    Therapeutic Exercise:  -Performed Passive MP extension, and Passive PIP and DIP flexion all digits of each hand. POLLY was performed in preparation for formulating a full composite fist. -Performed AROM for intrinsic stretches in preparation for formulating a  full composite fist with PROM to the end range.    PATIENT EDUCATION: Education details: Thumb CMC brace, Oval-8 splints Person educated: Patient Education method: Explanation, Demonstration, Verbal cues, and Handouts Education comprehension: verbalized understanding, returned demonstration, verbal cues required, tactile cues required, and needs further education  HOME EXERCISE PROGRAM:  Yellow theraputty, self passive ROM bilat hands   GOALS: Goals reviewed with patient? Yes  SHORT TERM GOALS: Target date: 11/06/2023     Pt.  will be independent with HEPs for hand strength, and [facility] skills Baseline: 10/13/23: Independent Eval: No current HEP Goal status: Achieved  LONG TERM GOALS: Target date: 12/18/2023    Pt. Will improve bilateral grip strength by 5# to be able to securely hold a weighted items. Baseline: 10/13/2023: R: 18#, L: 18#  Eval: R: 18#, L: 18# Pt. Is unable to hold weighted items, and casserole dishes. Goal status: Partially met  2.  Pt. Will improve bilateral pinch strength to be able to hold and use a peeler Baseline: 10/13/2023:Lateral pinch: Right: 6 lbs, Left: 5 lbs, and 3 point pinch: Right: 2 lbs, Left: 3 lb Eval: Lateral pinch: Right: 6 lbs, Left: 5  lbs, and 3 point pinch: Right: 2 lbs, Left: 3 lbs. Pt. is now able to hold and use a peeler. Goal status: Partially met  3.  Pt. Will improve Bilateral hand coordination skills to be able to pick up coins/small items off the surface of the table. Baseline: 10/13/23: Pt. Is improving with picking up items from the tabletop surface. Eval: 9 Hole Peg test: Right: 37 sec; Left: 37 sec Goal status: Partially met  4.  Pt. Will demonstrate 3 energy conservation/work simplification strategies for home management tasks. Baseline: 10/13/23: Able to identify, and use work simplification strategies for home management tasks. Eval: Education to be provided. Goal status: Achieved  5.  Pt. Will write a 3 sentence paragraph with 100% legibility in preparation for writing written correspondence Baseline: 10/13/23: 3 sentences completed in 1 min. 54 sec. With  85% legibility using a coban wrapped pen. Eval: 75% legibility for name only. Goal status: Improved   ASSESSMENT: CLINICAL IMPRESSION:  Pt. Presents with no pain this afternoon. Pt. is engaging her bilateral hands in more tasks at home. Pt. Is able to use work simplification strategies during ADLs/IADL tasks.  Pt. Is tolerating the right  thumb CMC splint and bilateral 3rd/4th digit Oval-8 splints well. Pt. is  able to write 3 sentences with efficiently with 85% legibility. Pt. is now independent with HEPs. Pt. is now ready for discharge from OT services with plans to continue with HEPs at home.  PERFORMANCE DEFICITS: in functional skills including ADLs, IADLs, coordination, dexterity, proprioception, ROM, strength, pain, Fine motor control, Gross motor control, decreased knowledge of precautions, and UE functional use, cognitive skills including , and psychosocial skills including coping strategies, environmental adaptation, habits, and routines and behaviors.   IMPAIRMENTS: are limiting patient from ADLs, IADLs, and leisure.   CO-MORBIDITIES: may have co-morbidities  that affects occupational performance. Patient will benefit from skilled OT to address above impairments and improve overall function.  MODIFICATION OR ASSISTANCE TO COMPLETE EVALUATION: Min-Moderate modification of tasks or assist with assess necessary to complete an evaluation.  OT OCCUPATIONAL PROFILE AND HISTORY: Detailed assessment: Review of records and additional review of physical, cognitive, psychosocial history related to current functional performance.  CLINICAL DECISION MAKING: Moderate - several treatment options, min-mod task modification necessary  REHAB POTENTIAL: Good  EVALUATION COMPLEXITY: Moderate    PLAN:  OT FREQUENCY: 2x/week  OT DURATION: 12 weeks  PLANNED INTERVENTIONS: 97168 OT Re-evaluation, 97535 self care/ADL training, 02889 therapeutic exercise, 97530 therapeutic activity, 97112 neuromuscular re-education, 97140 manual therapy, 97018 paraffin, 02989 moist heat, 97034 contrast bath, and 97033 iontophoresis, Orthotic fitting  RECOMMENDED OTHER SERVICES: PT  CONSULTED AND AGREED WITH PLAN OF CARE: Patient  PLAN FOR NEXT SESSION: Treatment  Richardson Otter, MS, OTR/L   10/13/2023, 3:41 PM

## 2023-10-13 NOTE — Therapy (Signed)
 OUTPATIENT PHYSICAL THERAPY DISCHARGE   Patient Name: Kim Hayes MRN: [id number] DOB:1945/08/21, 78 y.o., female Today's Date: 10/13/2023   PCP: Sadie Manna, MD  REFERRING PROVIDER: Sadie Manna, MD   END OF SESSION:    PT End of Session - 10/13/23 1320     Visit Number 9    Number of Visits 25    Date for Recertification  12/08/23    Authorization Type BCBS Medicare    Progress Note Due on Visit 10    PT Start Time 1320    PT Stop Time 1400    PT Time Calculation (min) 40 min    Activity Tolerance Patient tolerated treatment well;No increased pain;Patient limited by fatigue    Behavior During Therapy [facility] for tasks assessed/performed           Past Medical History:  Diagnosis Date   Hypertension    No past surgical history on file. There are no active problems to display for this patient.  ONSET DATE: 08/21/23  REFERRING DIAG:  F78.627 (ICD-10-CM) - Foot drop, left foot  G72.41 (ICD-10-CM) - Inclusion body myositis ([employer])   THERAPY DIAG:   Muscle weakness (generalized)  Other lack of coordination  Weakness of left foot  Imbalance  Difficulty in walking, not elsewhere classified  Rationale for Evaluation and Treatment: Rehabilitation  SUBJECTIVE:                                                                                                                                                                                             SUBJECTIVE STATEMENT:  Pt reports she has made good progress towards goals and is doing well enough to discharge.    PERTINENT HISTORY: Pt reports that she has been having some L foot drop for approximately one year.  Pt just started wearing the brace ~3-4 months ago as it was advised by an MD with [facility] in [place]. Pt is seeking therapy for the foot drop to see if she can improve it with therapy. Pt reports that she first started having difficulty with grasping items with the UE's and then  eventually progressed to having foot drop in the L side.  Pt reports the MD did educated her slightly on the body myositis diagnosis and that it would eventually cause her muscles to give out.Pt has hx of HTN and new diagnosis of body myositis.  PAIN:  Are you having pain? No  PRECAUTIONS: None  RED FLAGS: None   WEIGHT BEARING RESTRICTIONS: No  FALLS: Has patient fallen in last 6 months? Yes. Number of falls 1.  Pt reports she was  unable to pick the L foot up in December and she broke some of her ribs during the fall.  LIVING ENVIRONMENT: Lives with: lives with their spouse Lives in: House/apartment Stairs: Pt reports having one step from the porch into the living room. Has following equipment at home: Single point cane and Walker - 2 wheeled  PLOF: Independent  PATIENT GOALS: Strengthen muscles in order to improve walking ability.  OBJECTIVE:  Note: Objective measures were completed at Evaluation unless otherwise noted.                                                                                                                             TREATMENT DATE: 10/13/23  TherAct: To improve functional movements patterns for everyday tasks  Patient ambulated 20 min indoor/outdoor today- including tile, concrete, carpet, horizontal head turns, on varied surfaces: concrete, brick, asphalt, mulch, grass, up ramp, inclines, declines- with 0 seated rest break(s) - CGA for all indoor/outdoor - 0 instance of instability when ambulating - Focusing on overall endurance and to improve ambulation across uneven terrain that pt would come in contact with in the community.  Lateral stepping while 3# AW donned, and carrying 7KG medicine ball, ~12' each direction x5  Forward/Retro stepping while 3# AW donned, and carrying 7KG medicine ball, ~12' each direction x5   Neuro:  Static stance on Airex pad, 30 sec bouts Static stance on Airex pad, eyes closed 30 sec bouts Static NBOS on Airex pad, 30  sec bouts Static NBOS on Airex pad, eyes closed, 30 sec bouts Static staggered stance on Airex pad, 30 sec bouts Static staggered stance on Airex pad, eyes closed, 30 sec bouts Staggered stance with airex on 4 step and on floor, to challenge balance and stability, 30 sec bouts each LE leading    PATIENT EDUCATION: Education details: Pt educated on role of PT and services provided during current POC, along with prognosis and information about the clinic. Person educated: Patient Education method: Explanation, Demonstration, and Tactile cues Education comprehension: verbalized understanding  HOME EXERCISE PROGRAM: Access Code: [email or web address] URL: [email or web address] Date: 09/24/2023 Prepared by: Chiquita Silvan  Exercises - Seated Ankle Alphabet  - 1 x daily - 7 x weekly - 2 sets - 10 reps - 5 hold - Single Leg Heel Raise  - 1 x daily - 7 x weekly - 2 sets - 10 reps - 5 hold - Standing Ankle Dorsiflexion with Table Support  - 1 x daily - 7 x weekly - 2 sets - 10 reps - 5 hold - Single Leg Stance  - 1 x daily - 7 x weekly - 2 sets - 2 reps - 30 hold - Seated Ankle Plantar Flexion with Resistance Loop  - 1 x daily - 7 x weekly - 2 sets - 10 reps - 5 hold - Seated Ankle Inversion with Resistance  - 1 x daily - 7 x weekly - 2 sets - 10 reps - 5 hold -  Seated Toe Raise  - 1 x daily - 7 x weekly - 3 sets - 10 reps - Supine Ankle Eversion AROM  - 1 x daily - 7 x weekly - 3 sets - 10 reps  GOALS: Goals reviewed with patient? Yes  SHORT TERM GOALS: Target date: 10/13/2023 Pt will be independent with HEP in order to demonstrate increased ability to perform tasks related to occupation/hobbies. Baseline: HEP generated at evaluation Goal status: INITIAL  LONG TERM GOALS: Target date: 12/08/2023 1.  Patient (> 70 years old) will complete five times sit to stand test in < 12 seconds indicating an increased LE strength and improved balance. Baseline: 13.2 sec; 10/08/23: 8.12sec Goal  status: ACHIEVED   2.  Patient will increase FADI score to equal to or greater than  94%   to demonstrate statistically significant improvement in mobility and quality of life.  Baseline: 88%; 10/08/23: 94%  Goal status: ACHIEVED    3.  Patient will increase FGA score by > 4 points to demonstrate decreased fall risk during functional activities. Baseline: 12/30'; 10/08/23: 26/30 Goal status: ACHEVED   4.  Patient will reduce timed up and go to <10 seconds to reduce fall risk and demonstrate improved transfer/gait ability. Baseline: 10.18; 10/08/23: 8.89 sec c AFO, no device Goal status: ACHIEVED   5.  Patient will increase 10 meter walk test to >1.66m/s as to improve gait speed for better community ambulation and to reduce fall risk. Baseline: 10.6 sec; 0.94 m/s; 10/08/23: 9.51sec (1.39m/s)  Goal status: ACHIEVED    6.  Patient will increase six minute walk test distance to 392 m for progression to meet age matched norm and improve gait ability Baseline: 1243 feet (378.8 meters, Avg speed 1.05 m/s)9/18: achieved Goal status: ACHIEVED    ASSESSMENT:  CLINICAL IMPRESSION:    Pt continues to perform well with tasks and demonstrates improved balance as well.  Pt given two new exercises to add to the current HEP in order to improve overall balance and to keep the improved stability from therapy.  Pt otherwise is performing well and advised to contact the clinic if any needs arise.  Pt is formally discharged at this time.    OBJECTIVE IMPAIRMENTS: Abnormal gait, decreased activity tolerance, decreased balance, decreased endurance, decreased mobility, difficulty walking, decreased ROM, and decreased strength.   ACTIVITY LIMITATIONS: carrying, lifting, squatting, and bathing  PARTICIPATION LIMITATIONS: meal prep, cleaning, laundry, personal finances, shopping, community activity, and yard work  PERSONAL FACTORS: Time since onset of injury/illness/exacerbation and 1-2 comorbidities: HTN, DM2,  Inclusion body myositis are also affecting patient's functional outcome.   REHAB POTENTIAL: Good  CLINICAL DECISION MAKING: Stable/uncomplicated  EVALUATION COMPLEXITY: Low  PLAN:  PT FREQUENCY: 2x/week  PT DURATION: 12 weeks  PLANNED INTERVENTIONS: 97750- Physical Performance Testing, 97110-Therapeutic exercises, 97530- Therapeutic activity, [id number]- Neuromuscular re-education, 97535- Self Care, 02859- Manual therapy, [phone]- Gait training, [phone]- Electrical stimulation (manual), [phone] (1-2 muscles), 20561 (3+ muscles)- Dry Needling, Balance training, and Stair training  PLAN FOR NEXT SESSION:    D/C   Fonda Simpers, PT, DPT Physical Therapist - [facility]  10/13/23, 3:30 PM

## 2023-10-15 ENCOUNTER — Ambulatory Visit: Admitting: Occupational Therapy

## 2023-10-15 ENCOUNTER — Encounter: Admitting: Physical Therapy

## 2023-10-19 ENCOUNTER — Encounter

## 2023-10-19 ENCOUNTER — Ambulatory Visit

## 2023-10-22 ENCOUNTER — Encounter: Admitting: Physical Therapy

## 2023-10-22 ENCOUNTER — Encounter: Admitting: Occupational Therapy

## 2023-10-23 ENCOUNTER — Encounter: Admitting: Physical Therapy

## 2023-10-26 ENCOUNTER — Encounter

## 2023-10-29 ENCOUNTER — Ambulatory Visit

## 2023-10-29 ENCOUNTER — Encounter: Admitting: Occupational Therapy

## 2023-10-30 ENCOUNTER — Encounter

## 2023-11-02 ENCOUNTER — Encounter

## 2023-11-03 ENCOUNTER — Encounter

## 2023-11-05 ENCOUNTER — Encounter

## 2023-11-10 ENCOUNTER — Encounter

## 2023-11-11 ENCOUNTER — Other Ambulatory Visit: Payer: Self-pay | Admitting: Internal Medicine

## 2023-11-11 DIAGNOSIS — Z1231 Encounter for screening mammogram for malignant neoplasm of breast: Secondary | ICD-10-CM

## 2023-11-12 ENCOUNTER — Encounter: Admitting: Physical Therapy

## 2023-11-12 ENCOUNTER — Encounter

## 2023-11-17 ENCOUNTER — Encounter

## 2023-11-17 ENCOUNTER — Encounter: Admitting: Occupational Therapy

## 2023-11-19 ENCOUNTER — Encounter: Admitting: Physical Therapy

## 2023-11-19 ENCOUNTER — Encounter

## 2023-11-24 ENCOUNTER — Encounter

## 2023-11-24 ENCOUNTER — Encounter: Admitting: Occupational Therapy

## 2023-11-26 ENCOUNTER — Encounter

## 2023-12-01 ENCOUNTER — Encounter

## 2023-12-03 ENCOUNTER — Encounter: Admitting: Physical Therapy

## 2023-12-03 ENCOUNTER — Encounter

## 2023-12-08 ENCOUNTER — Encounter

## 2023-12-10 ENCOUNTER — Encounter

## 2023-12-24 ENCOUNTER — Ambulatory Visit
Admission: RE | Admit: 2023-12-24 | Discharge: 2023-12-24 | Disposition: A | Source: Ambulatory Visit | Attending: Internal Medicine | Admitting: Internal Medicine

## 2023-12-24 DIAGNOSIS — Z1231 Encounter for screening mammogram for malignant neoplasm of breast: Secondary | ICD-10-CM | POA: Insufficient documentation

## 2024-03-15 ENCOUNTER — Ambulatory Visit: Admit: 2024-03-15 | Admitting: Ophthalmology

## 2024-03-15 SURGERY — PHACOEMULSIFICATION, CATARACT, WITH IOL INSERTION
Anesthesia: Topical | Laterality: Right

## 2024-03-29 ENCOUNTER — Ambulatory Visit: Admit: 2024-03-29 | Admitting: Ophthalmology

## 2024-03-29 SURGERY — PHACOEMULSIFICATION, CATARACT, WITH IOL INSERTION
Anesthesia: Topical | Laterality: Left
